# Patient Record
Sex: Female | Born: 1965 | Race: White | Hispanic: No | Marital: Married | Smoking: Never smoker
Health system: Southern US, Community
[De-identification: ages and names within clinical notes are randomized; demographics above are authoritative.]

## PROBLEM LIST (undated history)

## (undated) DIAGNOSIS — I10 Essential (primary) hypertension: Secondary | ICD-10-CM

## (undated) DIAGNOSIS — E785 Hyperlipidemia, unspecified: Secondary | ICD-10-CM

## (undated) DIAGNOSIS — E079 Disorder of thyroid, unspecified: Secondary | ICD-10-CM

## (undated) HISTORY — PX: ANKLE ARTHROSCOPY: SUR85

## (undated) HISTORY — PX: TONSILLECTOMY: SUR1361

## (undated) HISTORY — DX: Hyperlipidemia, unspecified: E78.5

## (undated) HISTORY — DX: Essential (primary) hypertension: I10

---

## 1997-06-07 ENCOUNTER — Ambulatory Visit (HOSPITAL_COMMUNITY): Admission: RE | Admit: 1997-06-07 | Discharge: 1997-06-07 | Payer: Self-pay | Admitting: Otolaryngology

## 1998-03-30 ENCOUNTER — Other Ambulatory Visit: Admission: RE | Admit: 1998-03-30 | Discharge: 1998-03-30 | Payer: Self-pay | Admitting: Obstetrics and Gynecology

## 1999-04-29 ENCOUNTER — Other Ambulatory Visit: Admission: RE | Admit: 1999-04-29 | Discharge: 1999-04-29 | Payer: Self-pay | Admitting: Obstetrics and Gynecology

## 2000-08-25 ENCOUNTER — Other Ambulatory Visit: Admission: RE | Admit: 2000-08-25 | Discharge: 2000-08-25 | Payer: Self-pay | Admitting: Obstetrics and Gynecology

## 2001-09-30 ENCOUNTER — Other Ambulatory Visit: Admission: RE | Admit: 2001-09-30 | Discharge: 2001-09-30 | Payer: Self-pay | Admitting: Obstetrics and Gynecology

## 2002-03-30 ENCOUNTER — Other Ambulatory Visit: Admission: RE | Admit: 2002-03-30 | Discharge: 2002-03-30 | Payer: Self-pay | Admitting: Obstetrics and Gynecology

## 2002-04-05 ENCOUNTER — Encounter: Admission: RE | Admit: 2002-04-05 | Discharge: 2002-04-05 | Payer: Self-pay | Admitting: Obstetrics and Gynecology

## 2002-04-05 ENCOUNTER — Encounter: Payer: Self-pay | Admitting: Obstetrics and Gynecology

## 2003-05-09 ENCOUNTER — Other Ambulatory Visit: Admission: RE | Admit: 2003-05-09 | Discharge: 2003-05-09 | Payer: Self-pay | Admitting: Obstetrics and Gynecology

## 2003-10-03 ENCOUNTER — Encounter: Admission: RE | Admit: 2003-10-03 | Discharge: 2003-10-03 | Payer: Self-pay | Admitting: Endocrinology

## 2005-01-21 ENCOUNTER — Other Ambulatory Visit: Admission: RE | Admit: 2005-01-21 | Discharge: 2005-01-21 | Payer: Self-pay | Admitting: Obstetrics and Gynecology

## 2005-02-13 ENCOUNTER — Encounter: Admission: RE | Admit: 2005-02-13 | Discharge: 2005-02-13 | Payer: Self-pay | Admitting: Obstetrics and Gynecology

## 2005-11-28 ENCOUNTER — Encounter: Admission: RE | Admit: 2005-11-28 | Discharge: 2005-11-28 | Payer: Self-pay | Admitting: Endocrinology

## 2006-03-16 ENCOUNTER — Encounter: Admission: RE | Admit: 2006-03-16 | Discharge: 2006-03-16 | Payer: Self-pay | Admitting: Obstetrics and Gynecology

## 2007-10-25 ENCOUNTER — Encounter: Admission: RE | Admit: 2007-10-25 | Discharge: 2007-10-25 | Payer: Self-pay | Admitting: Obstetrics and Gynecology

## 2008-05-08 IMAGING — MG MM SCREEN MAMMOGRAM BILATERAL
4 series · 4 of 4 positions shown · non-contrast
Comparison: none

DG SCREEN MAMMOGRAM BILATERAL
Bilateral CC and MLO view(s) were taken.
Prior study comparison: April 05, 2002, bilateral diagnostic mammogram.

DIGITAL SCREENING MAMMOGRAM WITH CAD:
The breast tissue is extremely dense.  There is no dominant mass, architectural distortion or 
calcification to suggest malignancy.

[R CC]
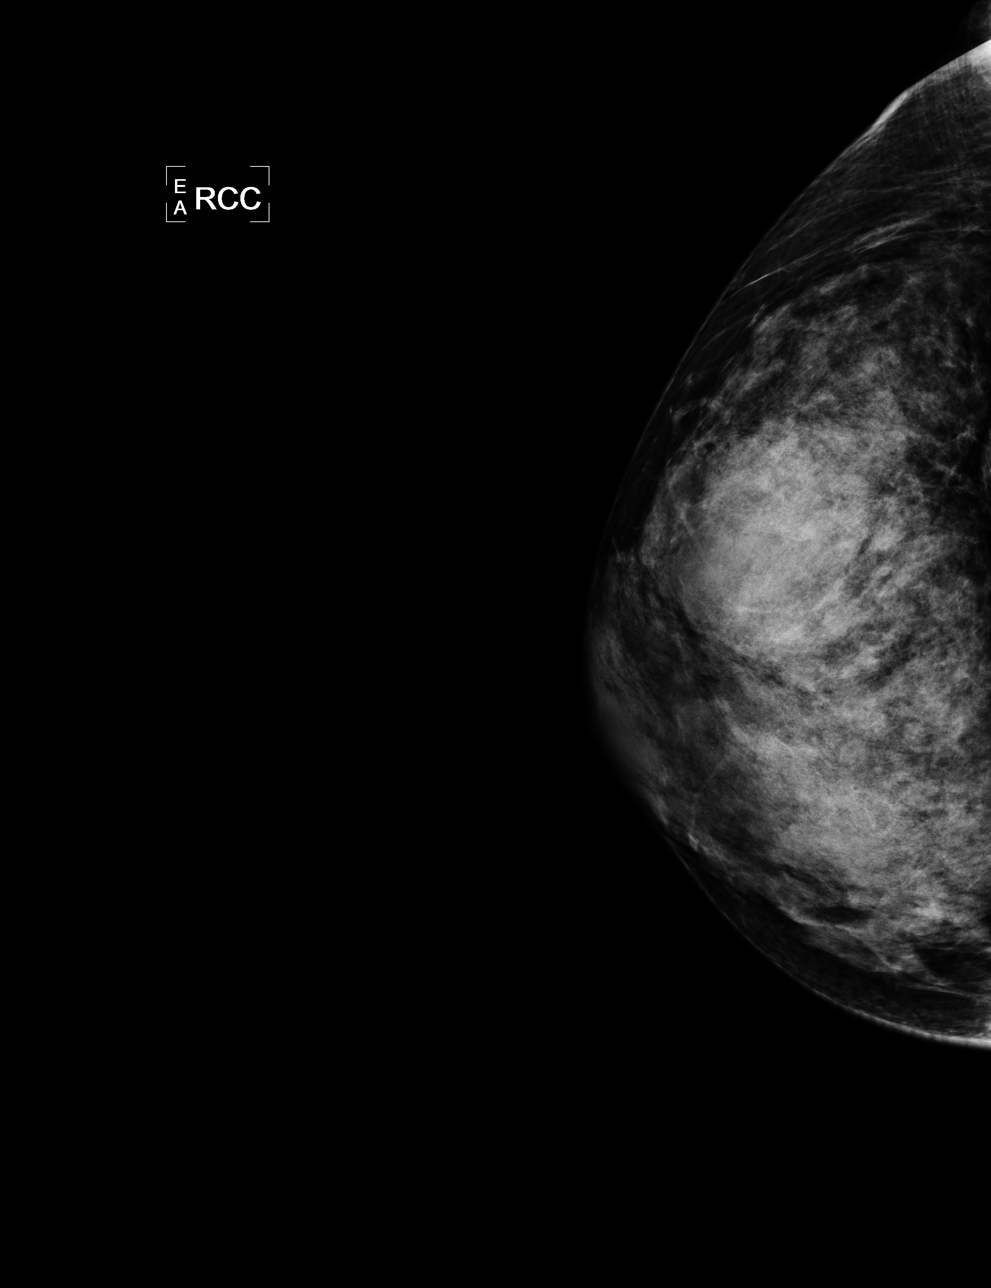

[L CC]
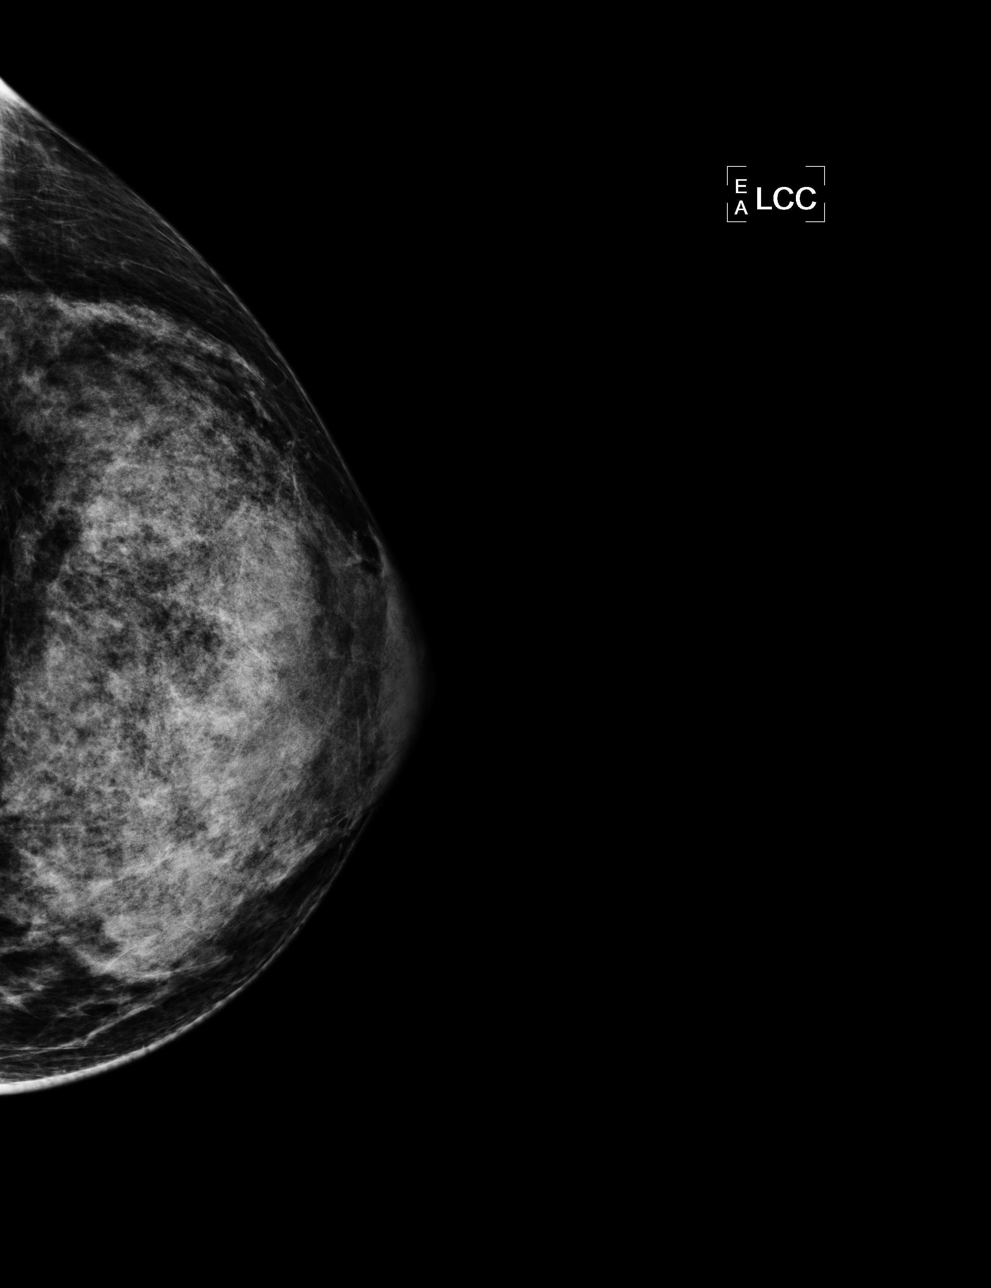

[L MLO]
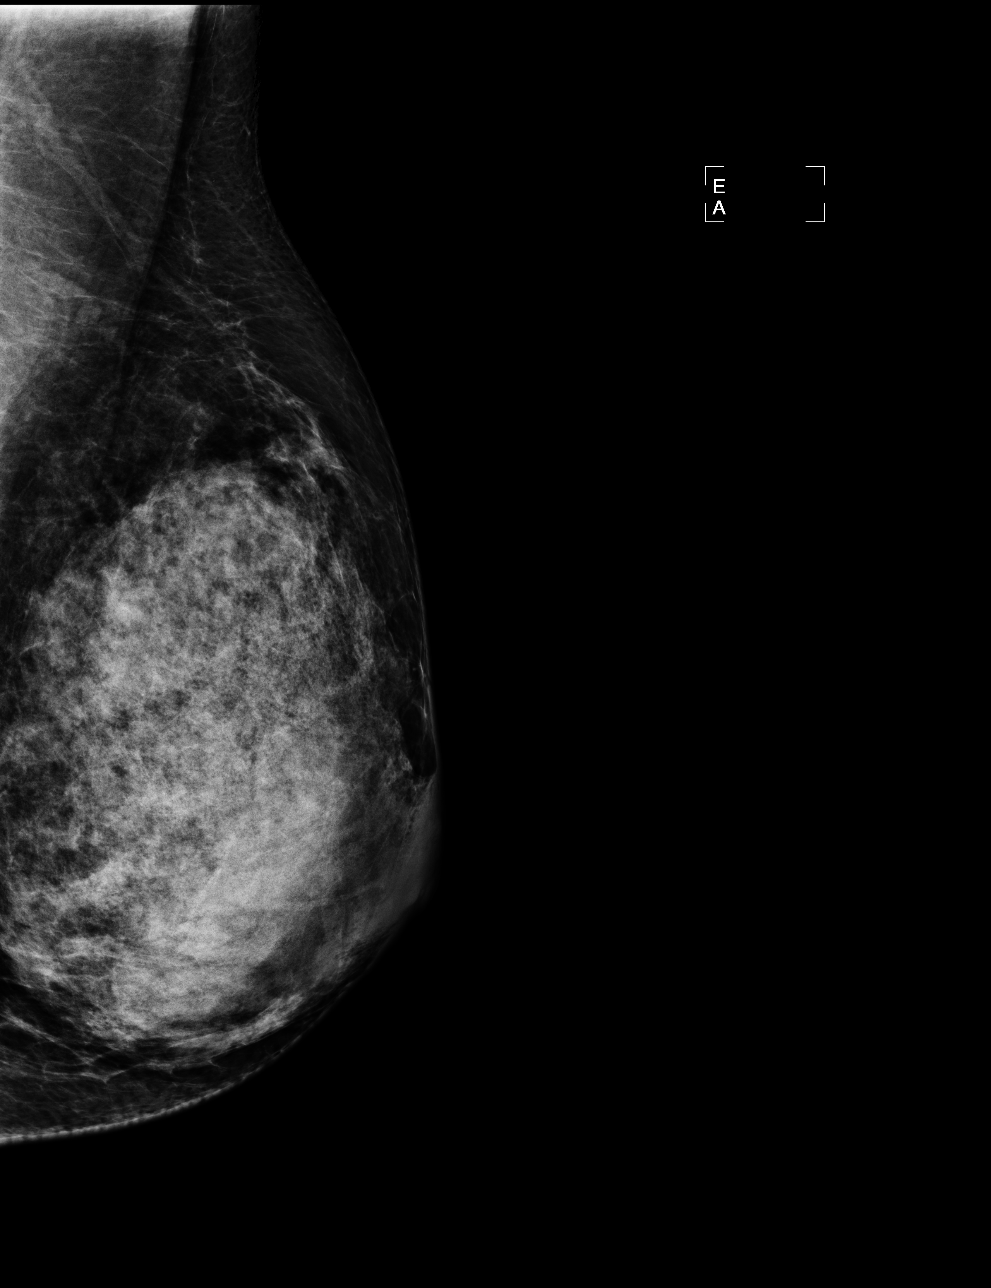

[R MLO]
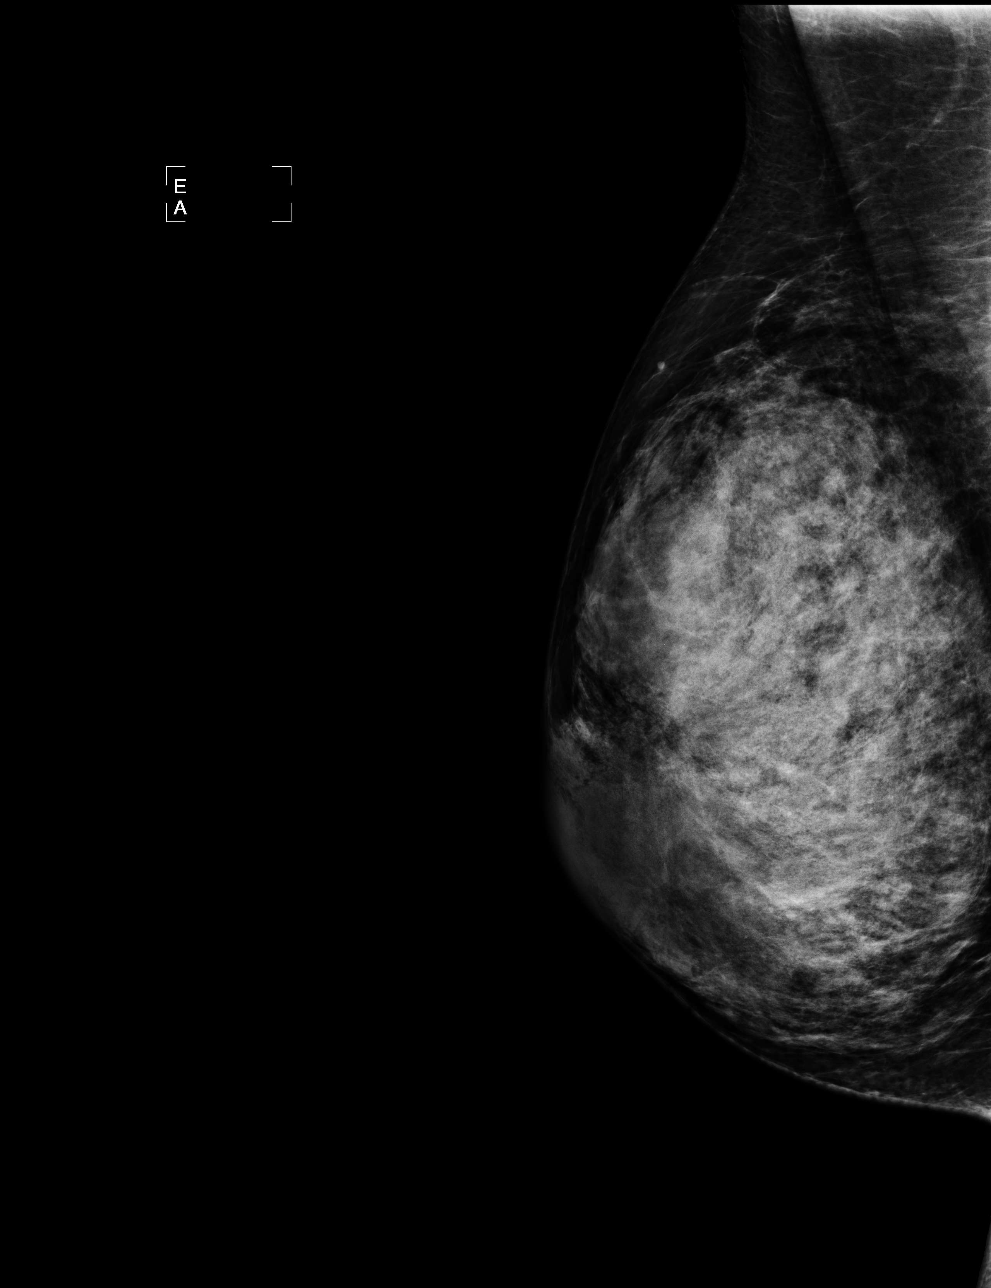

[4 of 4 positions shown; findings below may reference images not displayed]

IMPRESSION: No mammographic evidence of malignancy.  Suggest yearly screening mammography.

ASSESSMENT: Negative - BI-RADS 1

Screening mammogram in 1 year.
ANALYZED BY COMPUTER AIDED DETECTION. , THIS PROCEDURE WAS A DIGITAL MAMMOGRAM.

## 2008-06-11 IMAGING — US US SOFT TISSUE HEAD/NECK
1 series · 14 of 25 positions shown · non-contrast
Comparison: Thyroid ultrasound 10/03/03.

CLINICAL DATA: Follow up goiter. 
 THYROID ULTRASOUND:
TECHNIQUE: Ultrasound examination of the soft tissues was performed in the area of clinical concern.

[Series 1: unknown · 0.09mm/px · 14 of 30 slices shown]
[im 1/30]
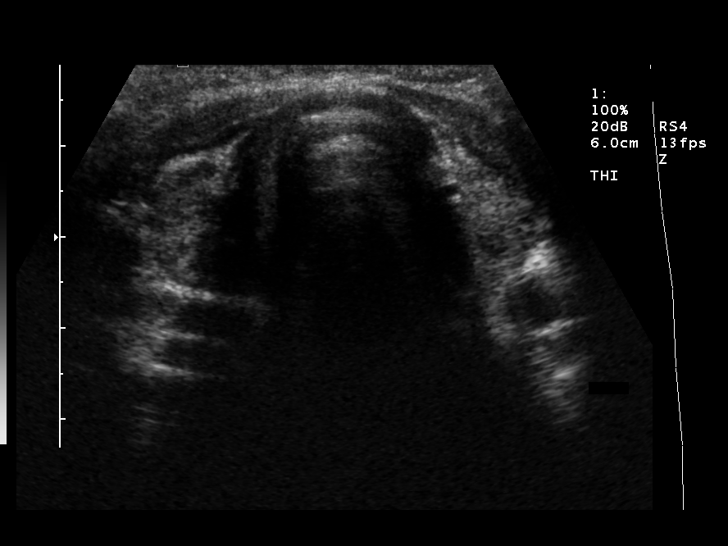
[im 3/30]
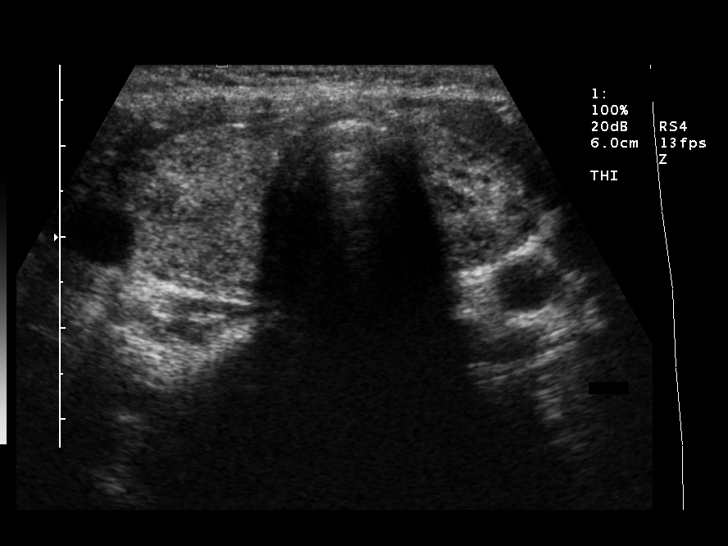
[im 5/30]
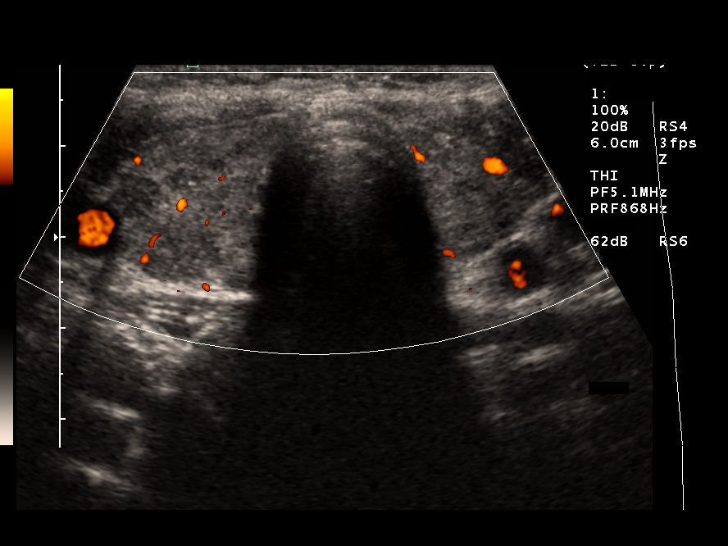
[im 8/30]
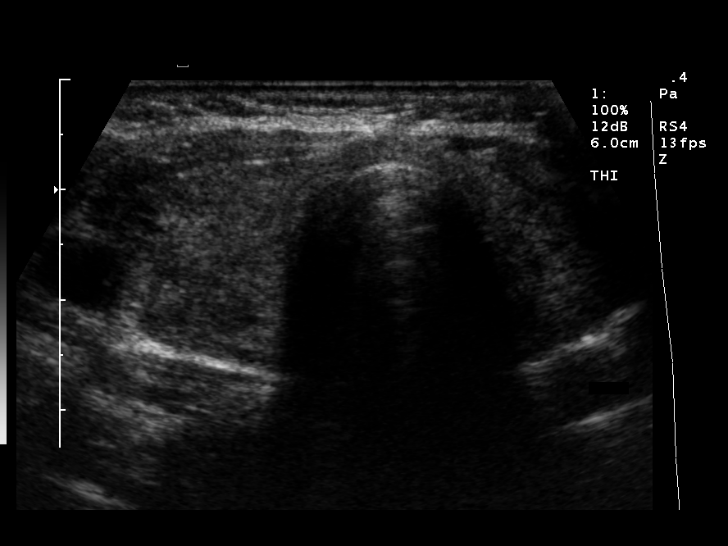
[im 10/30]
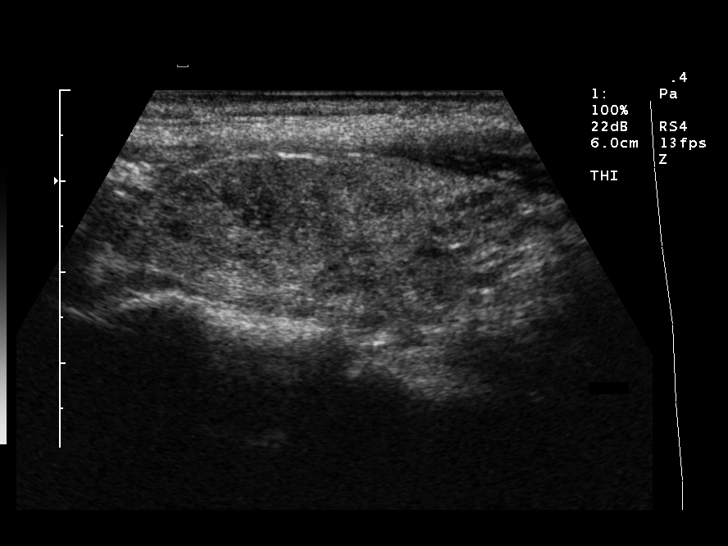
[im 11/30]
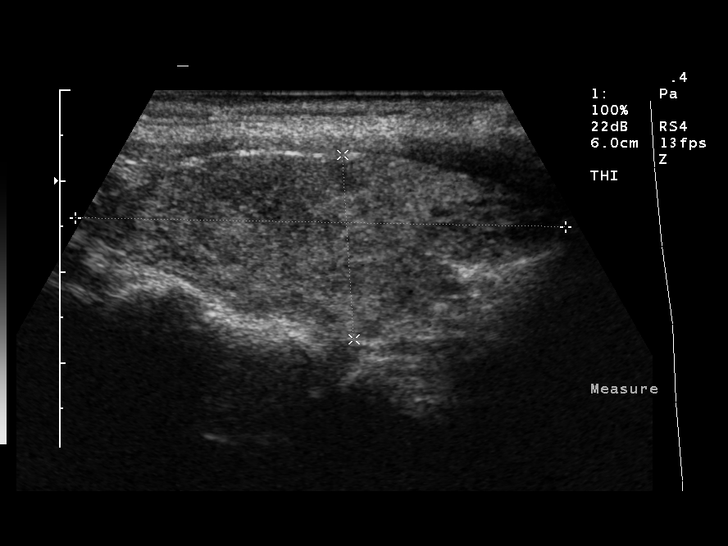
[im 14/30]
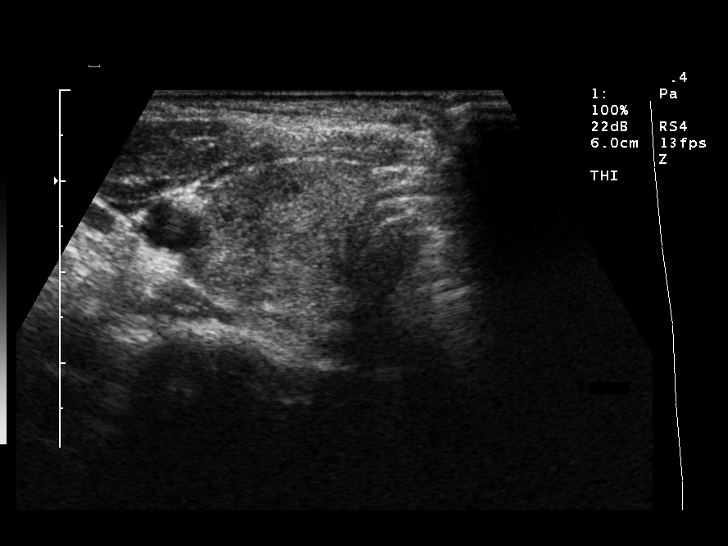
[im 16/30]
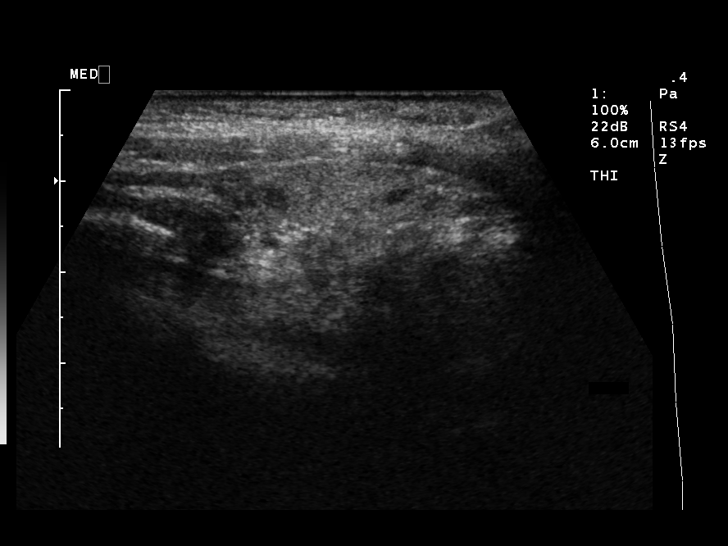
[im 19/30]
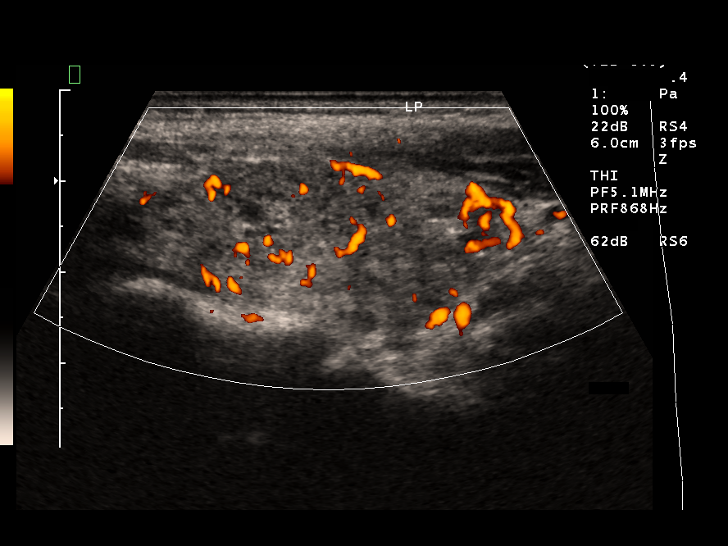
[im 20/30]
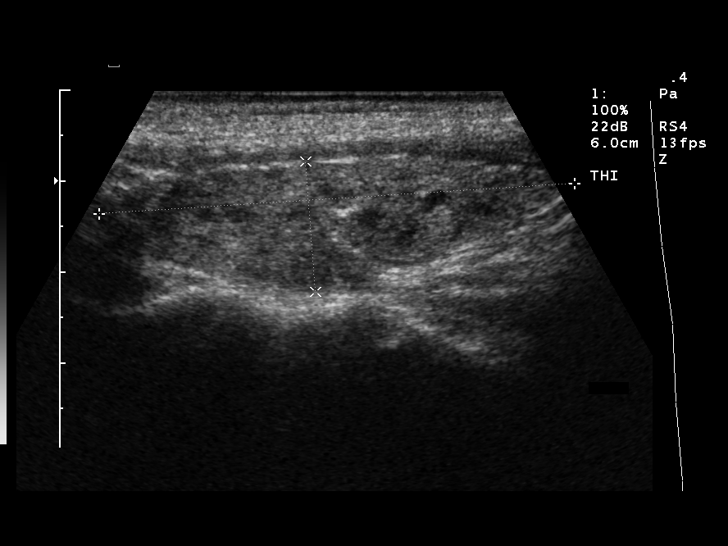
[im 22/30]
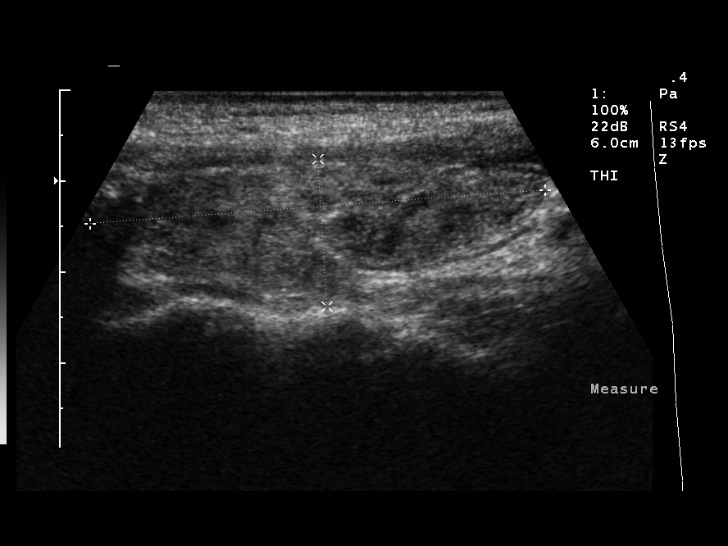
[im 25/30]
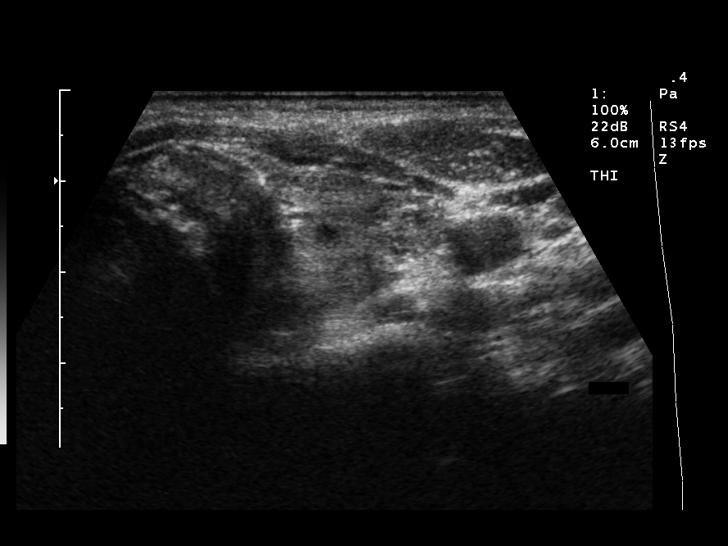
[im 27/30]
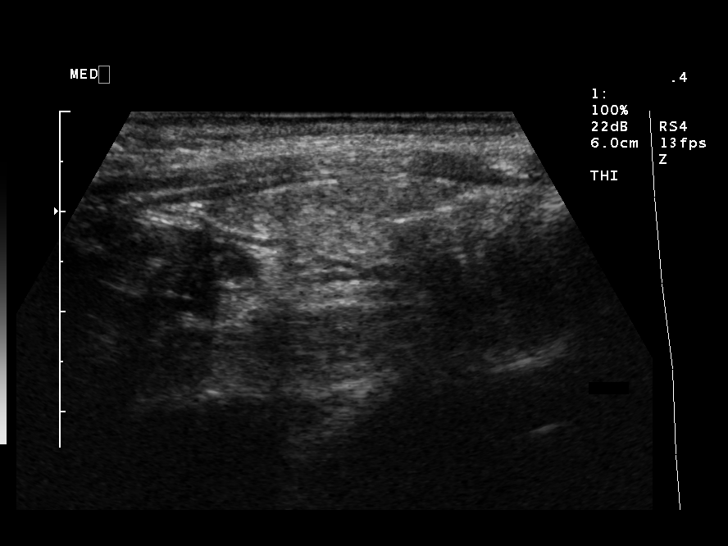
[im 30/30]
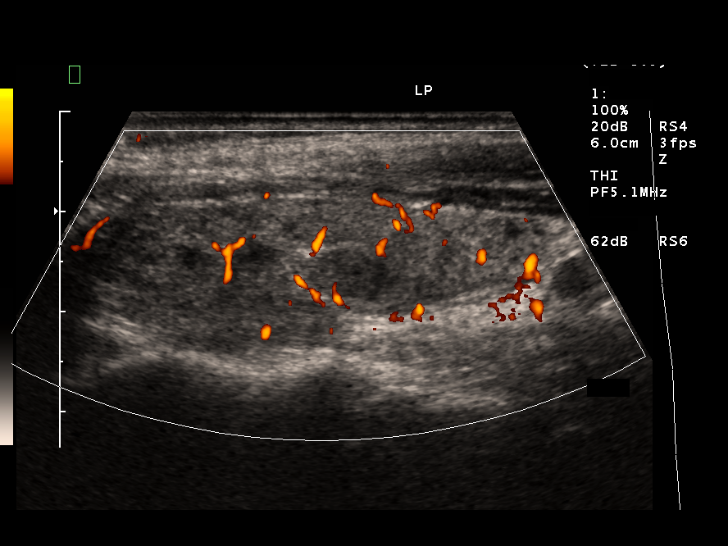

[14 of 25 positions shown; findings below may reference images not displayed]

FINDINGS: Currently right lobe thyroid measures 5.4cm long x 2cm AP x 1.9cm wide (previously 5.8 x 2 x 1.9cm).  Left lobe currently measures 5.2cm long x 1.4cm AP x 1.8cm wide (previously 5.2 x 1.4 x 1.8cm).  The isthmus measures 3mm as before.  No change in thyroid gland diffuse inhomogeneity and sub-cm nodules.
IMPRESSION: Stable multinodular goiter.

## 2009-02-21 ENCOUNTER — Encounter: Admission: RE | Admit: 2009-02-21 | Discharge: 2009-02-21 | Payer: Self-pay | Admitting: Obstetrics and Gynecology

## 2010-02-17 ENCOUNTER — Encounter (HOSPITAL_BASED_OUTPATIENT_CLINIC_OR_DEPARTMENT_OTHER): Payer: Self-pay | Admitting: General Surgery

## 2012-03-19 ENCOUNTER — Encounter (HOSPITAL_BASED_OUTPATIENT_CLINIC_OR_DEPARTMENT_OTHER): Payer: Self-pay | Admitting: *Deleted

## 2012-03-19 ENCOUNTER — Emergency Department (HOSPITAL_BASED_OUTPATIENT_CLINIC_OR_DEPARTMENT_OTHER)
Admission: EM | Admit: 2012-03-19 | Discharge: 2012-03-19 | Disposition: A | Discharge status: Home / Self Care | Payer: No Typology Code available for payment source | Attending: Emergency Medicine | Admitting: Emergency Medicine

## 2012-03-19 DIAGNOSIS — H538 Other visual disturbances: Secondary | ICD-10-CM | POA: Insufficient documentation

## 2012-03-19 DIAGNOSIS — Z79899 Other long term (current) drug therapy: Secondary | ICD-10-CM | POA: Insufficient documentation

## 2012-03-19 DIAGNOSIS — H5710 Ocular pain, unspecified eye: Secondary | ICD-10-CM | POA: Insufficient documentation

## 2012-03-19 DIAGNOSIS — E079 Disorder of thyroid, unspecified: Secondary | ICD-10-CM | POA: Insufficient documentation

## 2012-03-19 HISTORY — DX: Disorder of thyroid, unspecified: E07.9

## 2012-03-19 MED ORDER — TETRACAINE HCL 0.5 % OP SOLN
OPHTHALMIC | Status: AC
  Administered 2012-03-19: 20:00:00
  Filled 2012-03-19: qty 2

## 2012-03-19 MED ORDER — HYDROCODONE-ACETAMINOPHEN 5-325 MG PO TABS
1.0000 | ORAL_TABLET | Freq: Once | ORAL | Status: AC
  Administered 2012-03-19: 1 via ORAL
  Filled 2012-03-19: qty 1

## 2012-03-19 MED ORDER — CIPROFLOXACIN HCL 0.3 % OP SOLN: 2.0000 [drp] | Freq: Four times a day (QID) | OPHTHALMIC | Status: DC

## 2012-03-19 MED ORDER — FLUORESCEIN SODIUM 1 MG OP STRP
ORAL_STRIP | OPHTHALMIC | Status: AC
  Administered 2012-03-19: 20:00:00
  Filled 2012-03-19: qty 1

## 2012-03-19 MED ORDER — HYDROCODONE-ACETAMINOPHEN 5-325 MG PO TABS: 2.0000 | ORAL_TABLET | ORAL | Status: DC | PRN

## 2012-03-19 NOTE — ED Provider Notes (Signed)
History     CSN: [id number]  Arrival date & time 03/19/12  1749   First MD Initiated Contact with Patient 03/19/12 1756      Chief Complaint  Patient presents with  . Eye Pain    (Consider location/radiation/quality/duration/timing/severity/associated sxs/prior treatment) HPI Comments: Pt states that she was at work when the discomfort started:pt states that she put in a new pair of contacts today:no know injury:pt states that the vision seems a little blurry  Patient is a 47 y.o. female presenting with eye pain. The history is provided by the patient. No language interpreter was used.  Eye Pain This is a new problem. The current episode started today. The problem occurs constantly. The problem has been unchanged. Pertinent negatives include no fever. Nothing aggravates the symptoms. She has tried nothing for the symptoms.    Past Medical History  Diagnosis Date  . Thyroid disease     Past Surgical History  Procedure Laterality Date  . Tonsillectomy      History reviewed. No pertinent family history.  History  Substance Use Topics  . Smoking status: Never Smoker   . Smokeless tobacco: Not on file  . Alcohol Use: No    OB History   Grav Para Term Preterm Abortions TAB SAB Ect Mult Living                  Review of Systems  Constitutional: Negative for fever.  Eyes: Positive for pain.  Respiratory: Negative.   Cardiovascular: Negative.     Allergies  Septra  Home Medications   Current Outpatient Rx  Name  Route  Sig  Dispense  Refill  . fish oil-omega-3 fatty acids 1000 MG capsule   Oral   Take 2 g by mouth daily.         Marland Kitchen levothyroxine (SYNTHROID, LEVOTHROID) 125 MCG tablet   Oral   Take 125 mcg by mouth daily.         . ciprofloxacin (CILOXAN) 0.3 % ophthalmic solution   Right Eye   Place 2 drops into the right eye 4 (four) times daily. Administer 1 drop, every 2 hours, while awake, for 2 days. Then 1 drop, every 4 hours, while awake, for  the next 5 days.   5 mL   0   . HYDROcodone-acetaminophen (NORCO/VICODIN) 5-325 MG per tablet   Oral   Take 2 tablets by mouth every 4 (four) hours as needed for pain.   10 tablet   0     BP 130/73  Pulse 67  Temp(Src) 98 F (36.7 C) (Oral)  Resp 16  Ht 5\' 7"  (1.702 m)  Wt 160 lb (72.576 kg)  BMI 25.05 kg/m2  SpO2 99%  Physical Exam  Nursing note and vitals reviewed. Constitutional: She is oriented to person, place, and time. She appears well-developed and well-nourished.  HENT:  Head: Normocephalic.  Right Ear: External ear normal.  Left Ear: External ear normal.  Mouth/Throat: Oropharynx is clear and moist.  Eyes: Conjunctivae and EOM are normal. Pupils are equal, round, and reactive to light. No foreign bodies found. Right eye exhibits no discharge. No foreign body present in the right eye.  Fundoscopic exam:      The right eye shows no exudate.  Slit lamp exam:      The right eye shows no corneal abrasion and no fluorescein uptake.  Pressure was 14  Neck: Normal range of motion. Neck supple.  Cardiovascular: Normal rate and regular rhythm.  Pulmonary/Chest: Effort normal and breath sounds normal.  Musculoskeletal: Normal range of motion.  Neurological: She is alert and oriented to person, place, and time.  Skin: Skin is warm and dry.  Psychiatric: She has a normal mood and affect.    ED Course  Procedures (including critical care time)  Labs Reviewed - No data to display No results found.   1. Eye pain       MDM  Pressure is good:no ulceration or abrasion noted:pt given drops and instructed on no contact lense wearing:pt to follow up with opthomology        Teressa Lower, NP 03/19/12 2030

## 2012-03-19 NOTE — ED Notes (Signed)
Rx x 2 given for cipro eye drops and vicodin- pt d/c home with a ride

## 2012-03-19 NOTE — ED Notes (Signed)
Pt c/o right eye pain with ? Foreign body

## 2012-03-21 NOTE — ED Provider Notes (Signed)
History/physical exam/procedure(s) were performed by non-physician practitioner and as supervising physician I was immediately available for consultation/collaboration. I have reviewed all notes and am in agreement with care and plan. I saw and examined patient with Ms. Rubin Payor.  No ulcer seen, iop normal.   Hilario Quarry, MD 03/21/12 [id number]

## 2014-10-23 ENCOUNTER — Other Ambulatory Visit (HOSPITAL_COMMUNITY): Payer: Self-pay | Admitting: Respiratory Therapy

## 2014-10-23 DIAGNOSIS — G4730 Sleep apnea, unspecified: Secondary | ICD-10-CM

## 2014-10-23 DIAGNOSIS — G4700 Insomnia, unspecified: Secondary | ICD-10-CM

## 2016-06-25 ENCOUNTER — Other Ambulatory Visit: Payer: Self-pay | Admitting: Obstetrics and Gynecology

## 2016-06-25 DIAGNOSIS — Z1231 Encounter for screening mammogram for malignant neoplasm of breast: Secondary | ICD-10-CM

## 2016-07-04 ENCOUNTER — Ambulatory Visit
Admission: RE | Admit: 2016-07-04 | Discharge: 2016-07-04 | Disposition: A | Discharge status: Home / Self Care | Payer: Managed Care, Other (non HMO) | Source: Ambulatory Visit | Attending: Obstetrics and Gynecology | Admitting: Obstetrics and Gynecology

## 2016-07-04 DIAGNOSIS — Z1231 Encounter for screening mammogram for malignant neoplasm of breast: Secondary | ICD-10-CM

## 2019-01-16 IMAGING — MG 2D DIGITAL SCREENING BILATERAL MAMMOGRAM WITH CAD AND ADJUNCT TO
8 of 12 series · 8 of 28 positions shown · non-contrast
Comparison: Previous exam(s).

CLINICAL DATA: Screening.

EXAM:
2D DIGITAL SCREENING BILATERAL MAMMOGRAM WITH CAD AND ADJUNCT TOMO

[L CC]
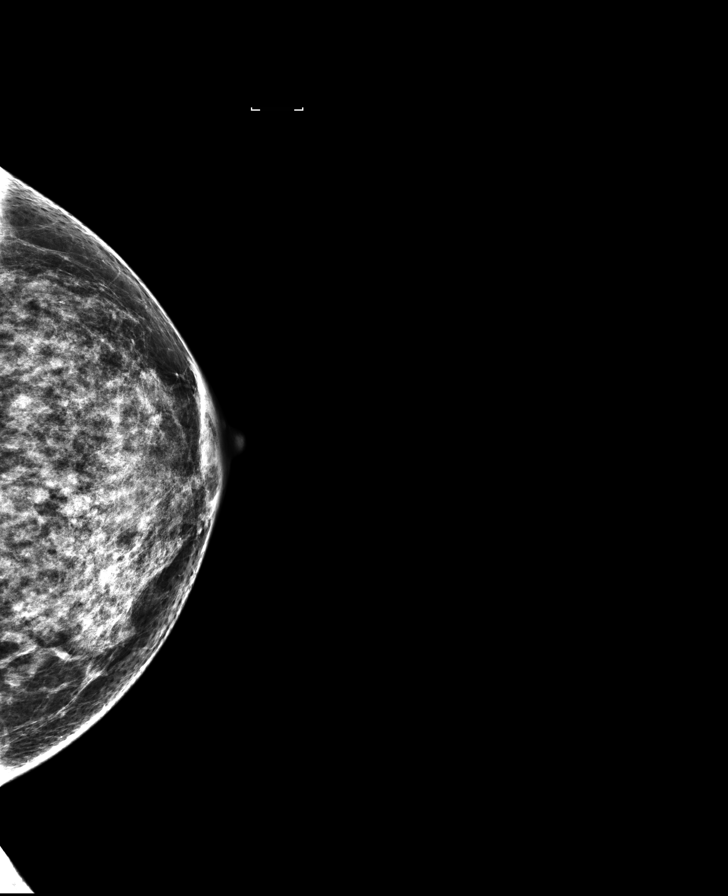

[R CC synth-2D]
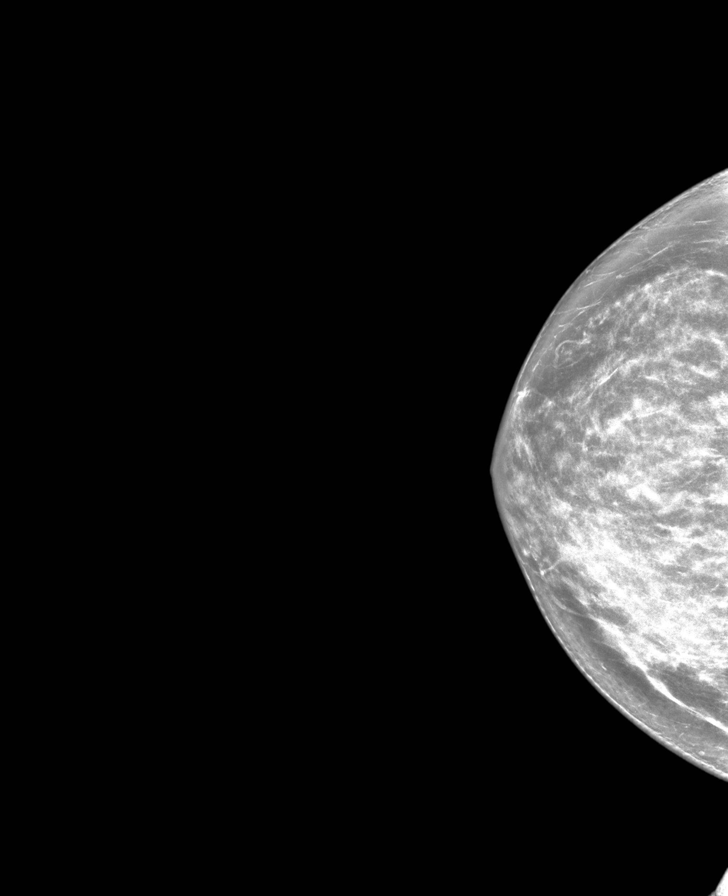

[R MLO synth-2D]
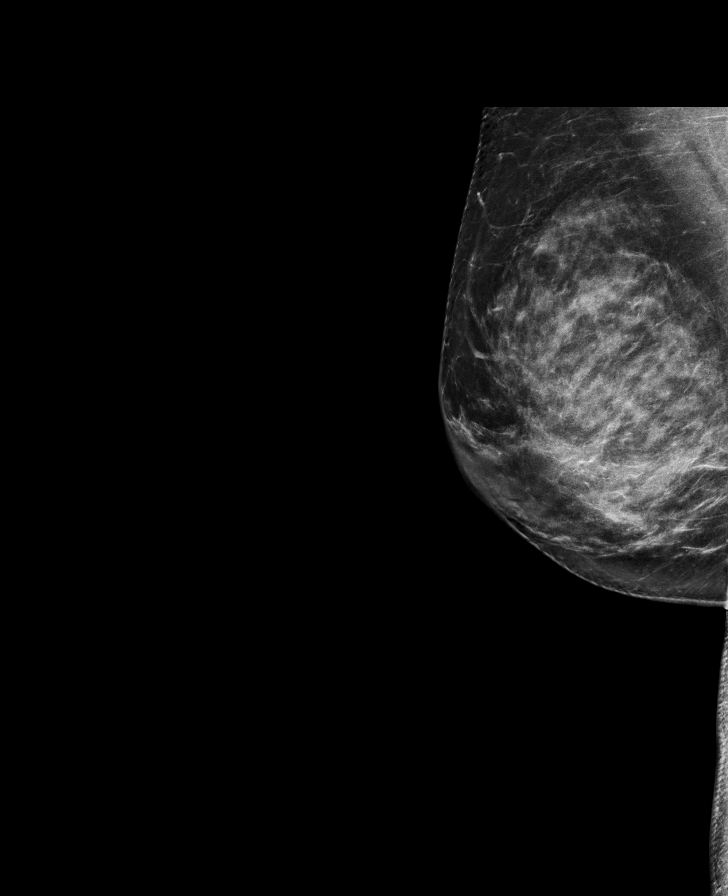

[L MLO synth-2D]
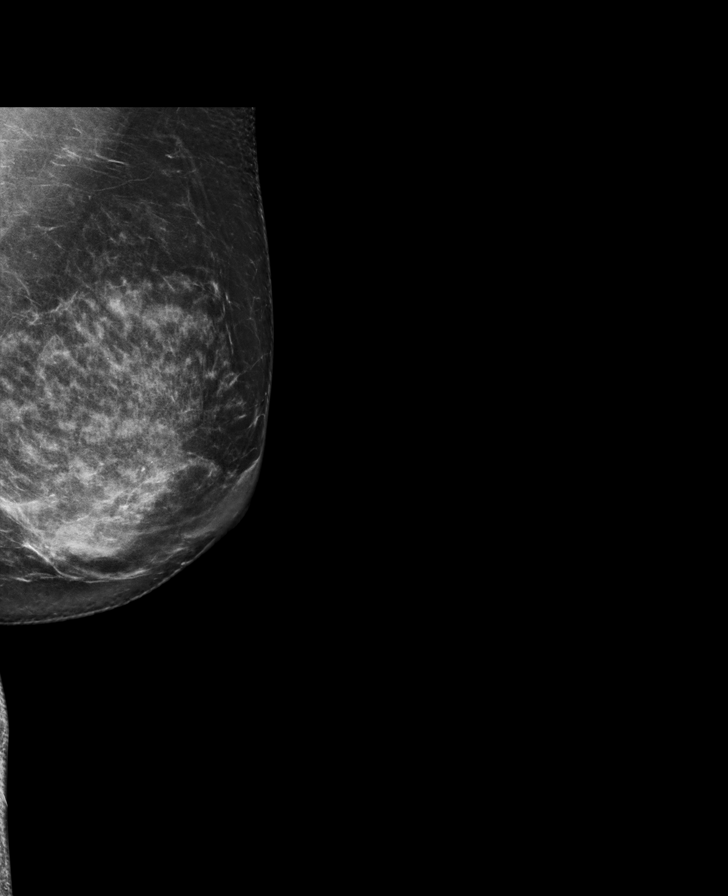

[R CC]
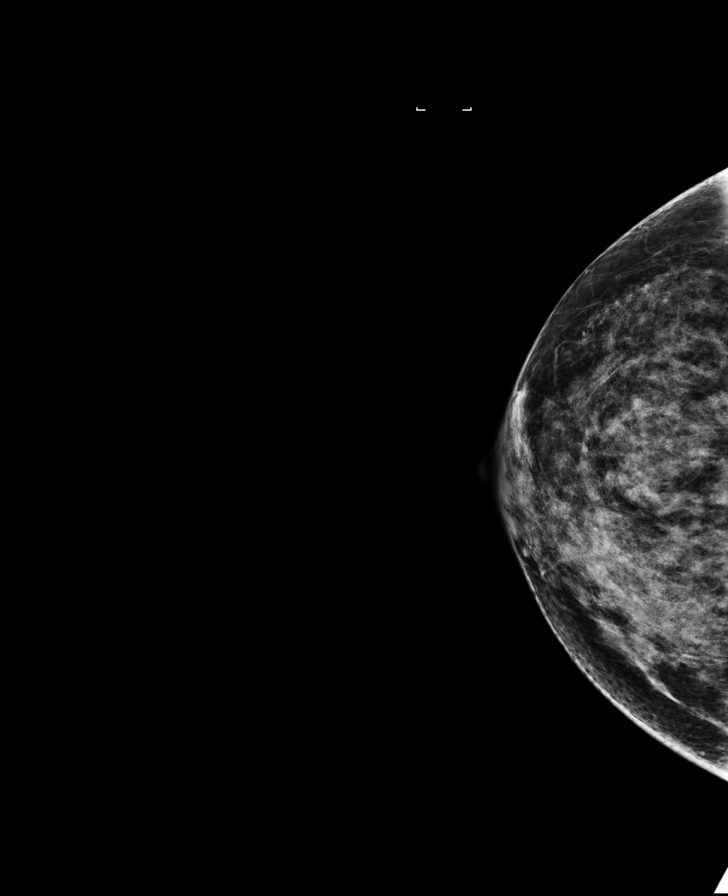

[L CC synth-2D]
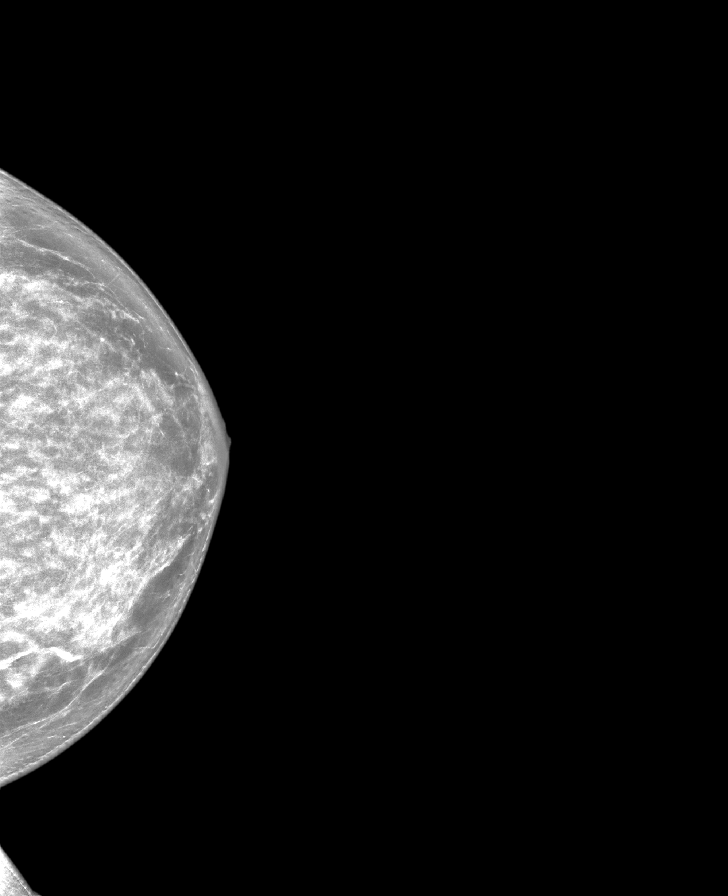

[L MLO]
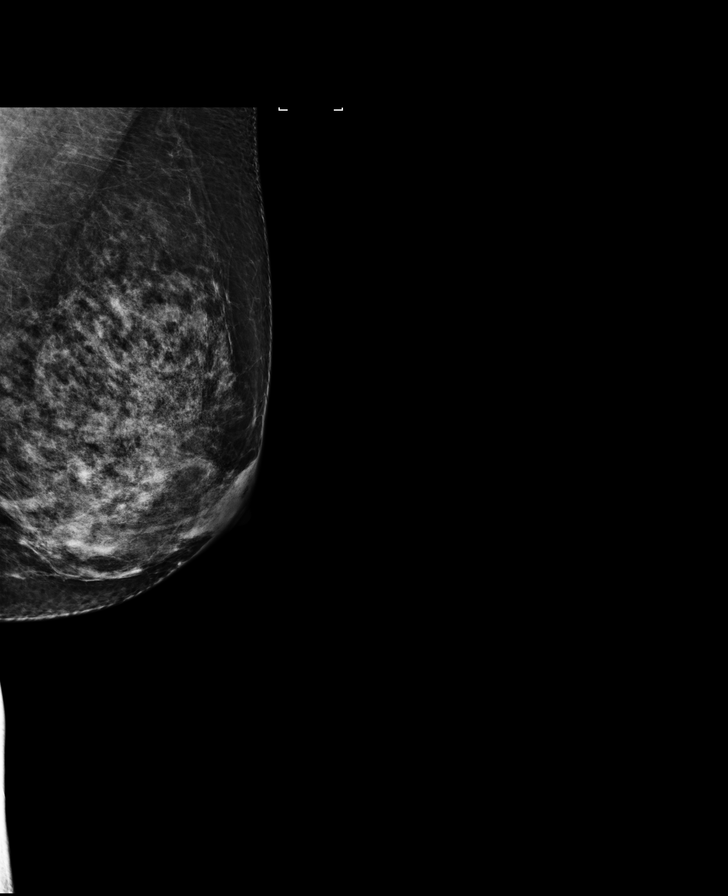

[R MLO]
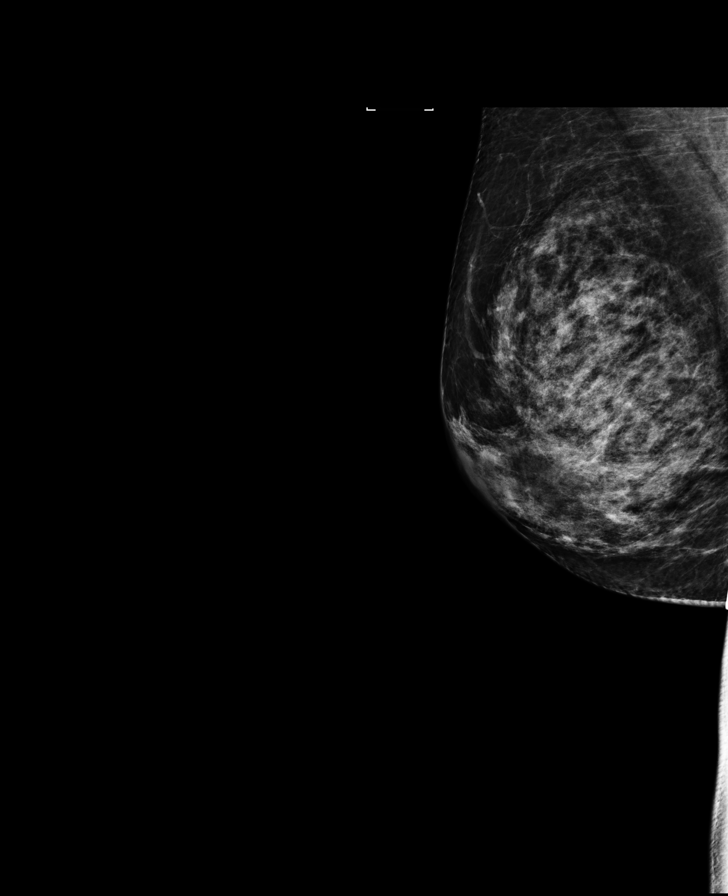

[8 of 28 positions shown; findings below may reference images not displayed]

ACR Breast Density Category c: The breast tissue is heterogeneously
dense, which may obscure small masses.
FINDINGS: There are no findings suspicious for malignancy. Images were
processed with CAD.
IMPRESSION: No mammographic evidence of malignancy. A result letter of this
screening mammogram will be mailed directly to the patient.

RECOMMENDATION:
Screening mammogram in one year. (Code:[id number])

BI-RADS CATEGORY  1: Negative.

## 2019-03-18 ENCOUNTER — Ambulatory Visit: Payer: 59 | Attending: Internal Medicine

## 2019-03-18 ENCOUNTER — Telehealth: Payer: Self-pay | Admitting: *Deleted

## 2019-03-18 DIAGNOSIS — Z23 Encounter for immunization: Secondary | ICD-10-CM | POA: Insufficient documentation

## 2019-03-18 NOTE — Telephone Encounter (Signed)
First responder, Vaccine appt 4:30 2/19. Parking instructions given.

## 2019-03-18 NOTE — Progress Notes (Signed)
   Covid-19 Vaccination Clinic  Name:  Melinda Morales    MRN: [id number] DOB: 1965-07-26  03/18/2019  Melinda Morales was observed post Covid-19 immunization for 15 minutes without incidence. She was provided with Vaccine Information Sheet and instruction to access the V-Safe system.   Melinda Morales was instructed to call 911 with any severe reactions post vaccine: Marland Kitchen Difficulty breathing  . Swelling of your face and throat  . A fast heartbeat  . A bad rash all over your body  . Dizziness and weakness    Immunizations Administered    Name Date Dose VIS Date Route   Pfizer COVID-19 Vaccine 03/18/2019  4:23 PM 0.3 mL 01/07/2019 Intramuscular   Manufacturer: [employer], [employer]   Lot: [id number]   NDC: [address]-9

## 2019-04-12 ENCOUNTER — Ambulatory Visit: Payer: 59 | Attending: Internal Medicine

## 2019-04-12 DIAGNOSIS — Z23 Encounter for immunization: Secondary | ICD-10-CM

## 2019-04-12 NOTE — Progress Notes (Signed)
   Covid-19 Vaccination Clinic  Name:  Melinda Morales    MRN: [id number] DOB: 1965-05-20  04/12/2019  [name] was observed post Covid-19 immunization for 15 minutes without incident. She was provided with Vaccine Information Sheet and instruction to access the V-Safe system.   [name] was instructed to call 911 with any severe reactions post vaccine: Marland Kitchen Difficulty breathing  . Swelling of face and throat  . A fast heartbeat  . A bad rash all over body  . Dizziness and weakness   Immunizations Administered    Name Date Dose VIS Date Route   Pfizer COVID-19 Vaccine 04/12/2019  8:32 AM 0.3 mL 01/07/2019 Intramuscular   Manufacturer: [employer], [employer]   Lot: [id number]   NDC: [address]-0

## 2021-03-01 LAB — HM PAP SMEAR: HM Pap smear: NEGATIVE

## 2021-10-09 ENCOUNTER — Other Ambulatory Visit: Payer: Self-pay | Admitting: Orthopaedic Surgery

## 2021-10-09 ENCOUNTER — Ambulatory Visit
Admission: RE | Admit: 2021-10-09 | Discharge: 2021-10-09 | Disposition: A | Discharge status: Home / Self Care | Payer: 59 | Source: Ambulatory Visit | Attending: Orthopaedic Surgery | Admitting: Orthopaedic Surgery

## 2021-10-09 DIAGNOSIS — M25571 Pain in right ankle and joints of right foot: Secondary | ICD-10-CM

## 2021-10-27 DIAGNOSIS — R000 Tachycardia, unspecified: Secondary | ICD-10-CM

## 2021-10-27 HISTORY — DX: Tachycardia, unspecified: R00.0

## 2021-11-06 ENCOUNTER — Other Ambulatory Visit: Payer: Self-pay

## 2021-11-06 ENCOUNTER — Encounter (HOSPITAL_BASED_OUTPATIENT_CLINIC_OR_DEPARTMENT_OTHER): Payer: Self-pay | Admitting: Emergency Medicine

## 2021-11-06 ENCOUNTER — Emergency Department (HOSPITAL_BASED_OUTPATIENT_CLINIC_OR_DEPARTMENT_OTHER)
Admission: EM | Admit: 2021-11-06 | Discharge: 2021-11-06 | Disposition: A | Discharge status: Home / Self Care | Payer: 59 | Attending: Emergency Medicine | Admitting: Emergency Medicine

## 2021-11-06 ENCOUNTER — Emergency Department (HOSPITAL_BASED_OUTPATIENT_CLINIC_OR_DEPARTMENT_OTHER): Payer: 59

## 2021-11-06 DIAGNOSIS — R079 Chest pain, unspecified: Secondary | ICD-10-CM

## 2021-11-06 DIAGNOSIS — R002 Palpitations: Secondary | ICD-10-CM | POA: Diagnosis not present

## 2021-11-06 DIAGNOSIS — R0602 Shortness of breath: Secondary | ICD-10-CM | POA: Insufficient documentation

## 2021-11-06 DIAGNOSIS — R0789 Other chest pain: Secondary | ICD-10-CM | POA: Insufficient documentation

## 2021-11-06 DIAGNOSIS — E039 Hypothyroidism, unspecified: Secondary | ICD-10-CM | POA: Insufficient documentation

## 2021-11-06 LAB — BASIC METABOLIC PANEL
Anion gap: 10 (ref 5–15)
BUN: 14 mg/dL (ref 6–20)
CO2: 25 mmol/L (ref 22–32)
Calcium: 9.2 mg/dL (ref 8.9–10.3)
Chloride: 104 mmol/L (ref 98–111)
Creatinine, Ser: 0.85 mg/dL (ref 0.44–1.00)
GFR, Estimated: >60 mL/min (ref >60)
Glucose, Bld: 196 mg/dL — ABNORMAL HIGH (ref 70–99)
Potassium: 3.6 mmol/L (ref 3.5–5.1)
Sodium: 139 mmol/L (ref 135–145)

## 2021-11-06 LAB — D-DIMER, QUANTITATIVE: D-Dimer, Quant: <0.27 ug/mL-FEU (ref 0.00–0.50)

## 2021-11-06 LAB — CBC
HCT: 44.3 % (ref 36.0–46.0)
Hemoglobin: 14.6 g/dL (ref 12.0–15.0)
MCH: 29.6 pg (ref 26.0–34.0)
MCHC: 33 g/dL (ref 30.0–36.0)
MCV: 89.9 fL (ref 80.0–100.0)
Platelets: 267 10*3/uL (ref 150–400)
RBC: 4.93 MIL/uL (ref 3.87–5.11)
RDW: 12.7 % (ref 11.5–15.5)
WBC: 5.4 10*3/uL (ref 4.0–10.5)
nRBC: 0 % (ref 0.0–0.2)

## 2021-11-06 LAB — URINALYSIS, ROUTINE W REFLEX MICROSCOPIC
Bilirubin Urine: NEGATIVE
Glucose, UA: NEGATIVE mg/dL
Ketones, ur: NEGATIVE mg/dL
Leukocytes,Ua: NEGATIVE
Nitrite: NEGATIVE
Protein, ur: NEGATIVE mg/dL
Specific Gravity, Urine: >=1.03 (ref 1.005–1.030)
pH: 5.5 (ref 5.0–8.0)

## 2021-11-06 LAB — URINALYSIS, MICROSCOPIC (REFLEX)

## 2021-11-06 LAB — TROPONIN I (HIGH SENSITIVITY)
Troponin I (High Sensitivity): 2 ng/L (ref <18)
Troponin I (High Sensitivity): 3 ng/L (ref <18)

## 2021-11-06 NOTE — ED Triage Notes (Signed)
Intermittent left sided chest pressure, fatigue, dizziness, and heart palpitations x 1 week. Sent here from [facility] for further eval. Has neg covid/flu test and normal EKG at [facility].

## 2021-11-06 NOTE — Discharge Instructions (Addendum)
You were seen in the emergency room for evaluation of your palpitations, chest pain, and shortness of breath.  Your lab work and chest x-ray were unremarkable.  I likely think this is muscular given that you had chest wall tenderness.  However, given your substantial family history, I would like for you to follow-up with a cardiologist for potential stress test.  I have sent in an ambulatory referral, however if you have not heard back within next few days, please call them to schedule an appointment.  Information will be included in this discharge paperwork.  If you have any concern, new or worsening symptoms, please return to the nearest emergency department for evaluation.  Contact a doctor if: Your chest pain does not go away. You feel depressed. You have a fever. Get help right away if: Your chest pain is worse. You have a cough that gets worse, or you cough up blood. You have very bad (severe) pain in your belly (abdomen). You pass out (faint). You have either of these for no clear reason: Sudden chest discomfort. Sudden discomfort in your arms, back, neck, or jaw. You have shortness of breath at any time. You suddenly start to sweat, or your skin gets clammy. You feel sick to your stomach (nauseous). You throw up (vomit). You suddenly feel lightheaded or dizzy. You feel very weak or tired. Your heart starts to beat fast, or it feels like it is skipping beats. These symptoms may be an emergency. Do not wait to see if the symptoms will go away. Get medical help right away. Call your local emergency services (911 in the [place] Do not drive yourself to the hospital.

## 2021-11-08 NOTE — ED Provider Notes (Signed)
[facility] EMERGENCY DEPARTMENT Provider Note   CSN: [id number] Arrival date & time: 11/06/21  1311     History Chief Complaint  Patient presents with   Chest Pain    Melinda Morales is a 56 y.o. female with history of hypothyroidism presents to the emergency department for evaluation of left-sided chest pain, occasional shortness of breath for the past week.  She was sent here from urgent care for further evaluation.  Negative COVID and flu test at urgent care.  She reports that she has a significant family history of cardiac disease in her immediate and extended family and was concerned given these chest pains.  She denies any heavy lifting or trauma to her chest.  She denies any worsening of the pain on exertion.  Denies any nausea, vomiting, abdominal pain, fevers, cough, lightheadedness, dizziness.   Chest Pain Associated symptoms: palpitations and shortness of breath   Associated symptoms: no abdominal pain, no dizziness, no fever, no nausea and no vomiting        Home Medications Prior to Admission medications   Medication Sig Start Date End Date Taking? Authorizing Provider  ciprofloxacin (CILOXAN) 0.3 % ophthalmic solution Place 2 drops into the right eye 4 (four) times daily. Administer 1 drop, every 2 hours, while awake, for 2 days. Then 1 drop, every 4 hours, while awake, for the next 5 days. 03/19/12   Teressa Lower, NP  fish oil-omega-3 fatty acids 1000 MG capsule Take 2 g by mouth daily.    [provider]  HYDROcodone-acetaminophen (NORCO/VICODIN) 5-325 MG per tablet Take 2 tablets by mouth every 4 (four) hours as needed for pain. 03/19/12   Teressa Lower, NP  levothyroxine (SYNTHROID, LEVOTHROID) 125 MCG tablet Take 125 mcg by mouth daily.    [provider]      Allergies    Hydrocodone-acetaminophen, Septra [sulfamethoxazole-trimethoprim], Oxycodone, and Sulfamethoxazole    Review of Systems   Review of Systems   Constitutional:  Negative for chills and fever.  Respiratory:  Positive for shortness of breath.   Cardiovascular:  Positive for chest pain and palpitations. Negative for leg swelling.  Gastrointestinal:  Negative for abdominal pain, constipation, diarrhea, nausea and vomiting.  Neurological:  Negative for dizziness and light-headedness.    Physical Exam Updated Vital Signs BP (!) 137/101   Pulse 82   Temp 98.1 F (36.7 C) (Oral)   Resp 19   Ht 5' 6.5" (1.689 m)   Wt 81.6 kg   SpO2 98%   BMI 28.62 kg/m  Physical Exam Vitals and nursing note reviewed.  Constitutional:      General: She is not in acute distress.    Appearance: Normal appearance. She is not ill-appearing or toxic-appearing.  HENT:     Head: Normocephalic and atraumatic.  Eyes:     General: No scleral icterus. Cardiovascular:     Rate and Rhythm: Normal rate and regular rhythm.  Pulmonary:     Effort: Pulmonary effort is normal. No respiratory distress.     Breath sounds: Normal breath sounds. No decreased breath sounds.     Comments: Clear to auscultation bilaterally.  No respiratory distress, nasal flaring, tripoding, cyanosis, or accessory muscle use.  She is speaking in full sentences with ease and is satting well on room air without any increased work of breathing. Chest:     Chest wall: Tenderness present. No crepitus.     Comments: Tenderness to the left side of the chest upon palpation mainly to the  left lateral aspect.  I do not feel any crepitus, masses, increased warmth, or any other overlying skin changes.  I did check the axilla for any lymphadenopathy and also performed a breast exam for the left breast.  Nipple appears normal and is not retracted.  I do not feel any immobile knots other than some typical fibrocystic breast changes. Abdominal:     General: Abdomen is flat. Bowel sounds are normal.     Palpations: Abdomen is soft.     Tenderness: There is no abdominal tenderness.  Musculoskeletal:         General: No deformity.     Cervical back: Normal range of motion.  Skin:    General: Skin is warm and dry.  Neurological:     General: No focal deficit present.     Mental Status: She is alert. Mental status is at baseline.     ED Results / Procedures / Treatments   Labs (all labs ordered are listed, but only abnormal results are displayed) Labs Reviewed  BASIC METABOLIC PANEL - Abnormal; Notable for the following components:      Result Value   Glucose, Bld 196 (*)    All other components within normal limits  URINALYSIS, ROUTINE W REFLEX MICROSCOPIC - Abnormal; Notable for the following components:   Hgb urine dipstick SMALL (*)    All other components within normal limits  URINALYSIS, MICROSCOPIC (REFLEX) - Abnormal; Notable for the following components:   Bacteria, UA RARE (*)    All other components within normal limits  CBC  D-DIMER, QUANTITATIVE  TROPONIN I (HIGH SENSITIVITY)  TROPONIN I (HIGH SENSITIVITY)    EKG EKG Interpretation  Date/Time:  Wednesday November 06 2021 13:26:55 EDT Ventricular Rate:  95 PR Interval:  150 QRS Duration: 78 QT Interval:  360 QTC Calculation: 452 R Axis:   90 Text Interpretation: Normal sinus rhythm Rightward axis T wave abnormality, consider inferior ischemia Abnormal ECG No previous ECGs available Confirmed by Dorie Rank [phone]) on 11/06/2021 4:56:57 PM  Radiology DG Chest 2 View  Result Date: 11/06/2021 CLINICAL DATA:  Chest pain for 1 week. EXAM: CHEST - 2 VIEW COMPARISON:  None Available. FINDINGS: Heart size and mediastinal contours are unremarkable. No pleural effusion or edema identified. No airspace opacities identified. Visualized osseous structures are unremarkable. IMPRESSION: No active cardiopulmonary disease. Electronically Signed   By: Kerby Moors M.D.   On: 11/06/2021 14:22     Procedures Procedures   Medications Ordered in ED Medications - No data to display  ED Course/ Medical Decision Making/  A&P                           Medical Decision Making Amount and/or Complexity of Data Reviewed Labs: ordered. Radiology: ordered.   56 year old female presents emerged department for evaluation of chest pain, shortness of breath, and palpitations for the past week.  Differential diagnosis includes was limited to ACS, arrhythmia, PE, pneumonia, pneumothorax, aneurysm, dissection, anxiety, costochondritis, chest wall pain, precordial symptoms.  Vital signs show slightly elevated blood pressure otherwise normal temperature, normal heart rate, satting well on room air with any increased work of breathing.  Physical exam as noted above.  Given the patient's significant family cardiac history although she does not have any personal cardiac history, I will proceed with a chest pain work-up including a D-dimer.  Patient does have some tenderness to her chest wall so I likely think this is musculoskeletal.  I  independently reviewed and interpreted the patient's labs and imaging.  CBC without leukocytosis or anemia.  Urinalysis shows small amount of hemoglobin with rare bacteria although there is 0-5 white blood cells seen.  Patient does not have any urinary symptoms.  BMP shows elevated glucose at 196, patient does not have a personal history of diabetes and follows up with her PCP.  I suggested that she follow-up with her primary care doctor for possible A1c recheck for prediabetes/diabetes.  Troponin at 2 with repeat at 3, delta 1.  D-dimer undetectable.  EKG read interpreted by my attending shows normal sinus rhythm with a rightward axis T wave abnormality, no previous EKGs to confirm this to.  Consider inferior ischemia.  Chest x-ray shows no active cardiopulmonary process.  All although given the EKG, patient's troponins are flat.  Her D-dimer is undetectable so I doubt any ACS or PE.  Chest x-ray did not show any signs of pneumonia or pneumothorax.  This does not sound like an aneurysm or  dissection.  Given the symptoms, again with her family history, I will place an ambulatory referral to cardiology for her to follow-up with.  I discussed the lab and imaging results with the patient at bedside.  We discussed the ambulatory referral to cardiology.  I recommended that they do not call her within the next few days to make sure that she calls them to schedule an appointment.  Recommended Tylenol or ibuprofen as needed for pain.  We discussed strict return precautions and red flag symptoms.  Patient verbalized understanding and agrees to the plan.  Patient is stable being discharged home in good condition.  I discussed this case with my attending physician who cosigned this note including patient's presenting symptoms, physical exam, and planned diagnostics and interventions. Attending physician stated agreement with plan or made changes to plan which were implemented.   Final Clinical Impression(s) / ED Diagnoses Final diagnoses:  Chest wall pain  Nonspecific chest pain  Palpitations    Rx / DC Orders ED Discharge Orders          Ordered    Ambulatory referral to Cardiology       Comments: If you have not heard from the Cardiology office within the next 72 hours please call [phone].   11/06/21 1848              Achille Rich, PA-C 11/11/21 Wille Glaser, MD 11/11/21 [phone]

## 2021-11-12 ENCOUNTER — Ambulatory Visit: Payer: 59 | Attending: Cardiovascular Disease

## 2021-11-12 ENCOUNTER — Ambulatory Visit: Payer: 59 | Attending: Cardiovascular Disease | Admitting: Cardiovascular Disease

## 2021-11-12 ENCOUNTER — Encounter: Payer: Self-pay | Admitting: Cardiovascular Disease

## 2021-11-12 VITALS — BP 120/60 | HR 86 | Ht 67.0 in | Wt 187.4 lb

## 2021-11-12 DIAGNOSIS — R072 Precordial pain: Secondary | ICD-10-CM

## 2021-11-12 DIAGNOSIS — R0789 Other chest pain: Secondary | ICD-10-CM | POA: Diagnosis not present

## 2021-11-12 DIAGNOSIS — R0609 Other forms of dyspnea: Secondary | ICD-10-CM

## 2021-11-12 DIAGNOSIS — R002 Palpitations: Secondary | ICD-10-CM | POA: Diagnosis not present

## 2021-11-12 MED ORDER — METOPROLOL TARTRATE 100 MG PO TABS: 100.0000 mg | ORAL_TABLET | Freq: Once | ORAL | 0 refills | Status: DC

## 2021-11-12 NOTE — Assessment & Plan Note (Signed)
Patient has described palpitations which has awakened her from sleep over the last several weeks.  I am going to get a 2-week Zio patch to further evaluate.

## 2021-11-12 NOTE — Patient Instructions (Signed)
Medication Instructions:  Your physician recommends that you continue on your current medications as directed. Please refer to the Current Medication list given to you today.  *If you need a refill on your cardiac medications before your next appointment, please call your pharmacy*   Lab Work: Your physician recommends that you return for lab work in: Cytogeneticist, BMET, TSH & Free T4   If you have labs (blood work) drawn today and your tests are completely normal, you will receive your results only by: MyChart Message (if you have MyChart) OR A paper copy in the mail If you have any lab test that is abnormal or we need to change your treatment, we will call you to review the results.   Testing/Procedures: Your physician has requested that you have an echocardiogram. Echocardiography is a painless test that uses sound waves to create images of your heart. It provides your doctor with information about the size and shape of your heart and how well your heart's chambers and valves are working. This procedure takes approximately one hour. There are no restrictions for this procedure. Please do NOT wear cologne, perfume, aftershave, or lotions (deodorant is allowed). Please arrive 15 minutes prior to your appointment time. This procedure will be done at [address]   ZIO XT- Long Term Monitor Instructions  Your physician has requested you wear a ZIO patch monitor for 14 days.  This is a single patch monitor. [employer] supplies one patch monitor per enrollment. Additional stickers are not available. Please do not apply patch if you will be having a Nuclear Stress Test,  Echocardiogram, Cardiac CT, MRI, or Chest Xray during the period you would be wearing the  monitor. The patch cannot be worn during these tests. You cannot remove and re-apply the  ZIO XT patch monitor.  Your ZIO patch monitor will be mailed 3 day USPS to your address on file. It may take 3-5 days   to receive your monitor after you have been enrolled.  Once you have received your monitor, please review the enclosed instructions. Your monitor  has already been registered assigning a specific monitor serial # to you.  Billing and Patient Assistance Program Information  We have supplied [employer] with any of your insurance information on file for billing purposes. [employer] offers a sliding scale Patient Assistance Program for patients that do not have  insurance, or whose insurance does not completely cover the cost of the ZIO monitor.  You must apply for the Patient Assistance Program to qualify for this discounted rate.  To apply, please call [employer] at [phone], select option 4, select option 2, ask to apply for  Patient Assistance Program. Meredeth Ide will ask your household income, and how many people  are in your household. They will quote your out-of-pocket cost based on that information.  [employer] will also be able to set up a 16-month, interest-free payment plan if needed.  Applying the monitor   Shave hair from upper left chest.  Hold abrader disc by orange tab. Rub abrader in 40 strokes over the upper left chest as  indicated in your monitor instructions.  Clean area with 4 enclosed alcohol pads. Let dry.  Apply patch as indicated in monitor instructions. Patch will be placed under collarbone on left  side of chest with arrow pointing upward.  Rub patch adhesive wings for 2 minutes. Remove white label marked "1". Remove the white  label marked "2". Rub patch adhesive wings for 2  additional minutes.  While looking in a mirror, press and release button in center of patch. A small green light will  flash 3-4 times. This will be your only indicator that the monitor has been turned on.  Do not shower for the first 24 hours. You may shower after the first 24 hours.  Press the button if you feel a symptom. You will hear a small click. Record Date, Time and  Symptom in the Patient  Logbook.  When you are ready to remove the patch, follow instructions on the last 2 pages of Patient  Logbook. Stick patch monitor onto the last page of Patient Logbook.  Place Patient Logbook in the blue and white box. Use locking tab on box and tape box closed  securely. The blue and white box has prepaid postage on it. Please place it in the mailbox as  soon as possible. Your physician should have your test results approximately 7 days after the  monitor has been mailed back to [facility].  Call [place] at [phone] if you have questions regarding  your ZIO XT patch monitor. Call them immediately if you see an orange light blinking on your  monitor.  If your monitor falls off in less than 4 days, contact our Monitor department at [phone].  If your monitor becomes loose or falls off after 4 days call [employer] at [phone] for  suggestions on securing your monitor    Follow-Up: At [facility], you and your health needs are our priority.  As part of our continuing mission to provide you with exceptional heart care, we have created designated Provider Care Teams.  These Care Teams include your primary Cardiologist (physician) and Advanced Practice Providers (APPs -  Physician Assistants and Nurse Practitioners) who all work together to provide you with the care you need, when you need it.  We recommend signing up for the patient portal called "MyChart".  Sign up information is provided on this After Visit Summary.  MyChart is used to connect with patients for Virtual Visits (Telemedicine).  Patients are able to view lab/test results, encounter notes, upcoming appointments, etc.  Non-urgent messages can be sent to your provider as well.   To learn more about what you can do with MyChart, go to [email or web address].    Your next appointment:   5-6 week(s)  The format for your next appointment:   In Person  Provider:   Quay Burow, MD     Other Instructions   Your cardiac CT will be scheduled at the below location:   [facility] [address] [place], [place] [address] [phone]   If scheduled at [facility], please arrive at the [facility] Entrance (Entrance C2) of [facility] 30 minutes prior to test start time. You can use the FREE valet parking offered at entrance C (encouraged to control the heart rate for the test)  Proceed to the [facility] Radiology Department (first floor) to check-in and test prep.  All radiology patients and guests should use entrance C2 at [facility], accessed from [facility], even though the hospital's physical address listed is [address]     Please follow these instructions carefully (unless otherwise directed):   On the Night Before the Test: Be sure to Drink plenty of water. Do not consume any caffeinated/decaffeinated beverages or chocolate 12 hours prior to your test. Do not take any antihistamines 12 hours prior to your test.  On the Day of the Test: Drink plenty of water until 1 hour prior to the test. Do not eat any food 1 hour prior to test. You may take your regular medications prior to the test.  Take metoprolol (Lopressor) 100mg  two hours prior to test. HOLD Furosemide/Hydrochlorothiazide morning of the test.       After the Test: Drink plenty of water. After receiving IV contrast, you may experience a mild flushed feeling. This is normal. On occasion, you may experience a mild rash up to 24 hours after the test. This is not dangerous. If this occurs, you can take Benadryl 25 mg and increase your fluid intake. If you experience trouble breathing, this can be serious. If it is severe call 911 IMMEDIATELY. If it is mild, please call our office. If you take any of these medications: Glipizide/Metformin, Avandament, Glucavance, please do not take 48 hours after completing test unless  otherwise instructed.  We will call to schedule your test 2-4 weeks out understanding that some insurance companies will need an authorization prior to the service being performed.   For non-scheduling related questions, please contact the cardiac imaging nurse navigator should you have any questions/concerns: , Cardiac Imaging Nurse Navigator [place], Cardiac Imaging Nurse Navigator [facility] Direct Office Dial: [phone]   For scheduling needs, including cancellations and rescheduling, please call [phone], [phone].

## 2021-11-12 NOTE — Progress Notes (Signed)
11/12/2021 Melinda Morales   1965/11/30  [id number]  Primary Physician Melinda School, NP Primary Cardiologist: Melinda Gess MD Melinda Morales, Melinda Morales  HPI:  Melinda Morales is a 56 y.o. mildly overweight married Caucasian female with no children who was referred by the ER for chest pain after recent evaluation in 11/06/2021.  She is a Product manager.  She has no risk factors for ischemic heart disease.  Her mother did have a myocardial infarction however this was at age 70.  She is never had heart attack or stroke.  She did break her ankle in August and wore a boot for a while.  Over the last several weeks she has been awakened from sleep with palpitations as described atypical left-sided chest pain with dyspnea on exertion.  She was seen in the ER 11/06/2021 with the symptoms and had a negative work-up at that time.   Current Meds  Medication Sig   Calcium Carbonate (CALCIUM 500 PO) Take 500 mg by mouth.   cetirizine (ZYRTEC) 10 MG tablet Take 10 mg by mouth daily.   Cholecalciferol (VITAMIN D-1000 MAX ST) 25 MCG (1000 UT) tablet Take by mouth.   fish oil-omega-3 fatty acids 1000 MG capsule Take 2 g by mouth daily.   levothyroxine (SYNTHROID, LEVOTHROID) 125 MCG tablet Take 125 mcg by mouth daily.   Multiple Vitamin (MULTI-VITAMIN) tablet Take 1 tablet by mouth daily.     Allergies  Allergen Reactions   Hydrocodone-Acetaminophen Nausea Only   Septra [Sulfamethoxazole-Trimethoprim] Hives   Oxycodone Nausea Only   Sulfamethoxazole Rash    Social History   Socioeconomic History   Marital status: Married    Spouse name: Not on file   Number of children: Not on file   Years of education: Not on file   Highest education level: Not on file  Occupational History   Not on file  Tobacco Use   Smoking status: Never   Smokeless tobacco: Never  Vaping Use   Vaping Use: Never used  Substance and Sexual Activity   Alcohol use: No   Drug use: No   Sexual  activity: Never    Birth control/protection: None  Other Topics Concern   Not on file  Social History Narrative   Not on file   Social Determinants of Health   Financial Resource Strain: Not on file  Food Insecurity: Not on file  Transportation Needs: Not on file  Physical Activity: Not on file  Stress: Not on file  Social Connections: Not on file  Intimate Partner Violence: Not on file     Review of Systems: General: negative for chills, fever, night sweats or weight changes.  Cardiovascular: negative for chest pain, dyspnea on exertion, edema, orthopnea, palpitations, paroxysmal nocturnal dyspnea or shortness of breath Dermatological: negative for rash Respiratory: negative for cough or wheezing Urologic: negative for hematuria Abdominal: negative for nausea, vomiting, diarrhea, bright red blood per rectum, melena, or hematemesis Neurologic: negative for visual changes, syncope, or dizziness All other systems reviewed and are otherwise negative except as noted above.    Blood pressure 120/60, pulse 86, height 5\' 7"  (1.702 m), weight 187 lb 6.4 oz (85 kg), SpO2 93 %.  General appearance: alert and no distress Neck: no adenopathy, no carotid bruit, no JVD, supple, symmetrical, trachea midline, and thyroid not enlarged, symmetric, no tenderness/mass/nodules Lungs: clear to auscultation bilaterally Heart: regular rate and rhythm, S1, S2 normal, no murmur, click, rub or gallop Extremities: extremities  normal, atraumatic, no cyanosis or edema Pulses: 2+ and symmetric Skin: Skin color, texture, turgor normal. No rashes or lesions Neurologic: Grossly normal  EKG sinus rhythm at 86 with poor R wave progression.  Personally reviewed this EKG.  ASSESSMENT AND PLAN:   Atypical chest pain [name] was referred by the emergency room for atypical chest pain.  She was recently seen on 11/06/2021 with left lateral chest pain.  Her work-up was unrevealing.  Her pain was off and on for  several hours.  She really has no cardiac risk factors.  I am going to order a coronary CTA to further evaluate.  Palpitations Patient has described palpitations which has awakened her from sleep over the last several weeks.  I am going to get a 2-week Zio patch to further evaluate.  Dyspnea on exertion Patient describes increased dyspnea on exertion over the last several weeks doing normal activity.  We will check a 2D echo to further evaluate.     Melinda Harp MD FACP,FACC,FAHA, [facility] 11/12/2021 2:26 PM

## 2021-11-12 NOTE — Assessment & Plan Note (Signed)
Melinda Morales was referred by the emergency room for atypical chest pain.  She was recently seen on 11/06/2021 with left lateral chest pain.  Her work-up was unrevealing.  Her pain was off and on for several hours.  She really has no cardiac risk factors.  I am going to order a coronary CTA to further evaluate.

## 2021-11-12 NOTE — Assessment & Plan Note (Signed)
Patient describes increased dyspnea on exertion over the last several weeks doing normal activity.  We will check a 2D echo to further evaluate.

## 2021-11-12 NOTE — Progress Notes (Unsigned)
Enrolled for [employer] to mail a ZIO XT long term holter monitor to the patients address on file.  

## 2021-11-13 LAB — LIPID PANEL
Chol/HDL Ratio: 3.3 ratio (ref 0.0–4.4)
Cholesterol, Total: 231 mg/dL — ABNORMAL HIGH (ref 100–199)
HDL: 70 mg/dL (ref >39)
LDL Chol Calc (NIH): 140 mg/dL — ABNORMAL HIGH (ref 0–99)
Triglycerides: 118 mg/dL (ref 0–149)
VLDL Cholesterol Cal: 21 mg/dL (ref 5–40)

## 2021-11-13 LAB — BASIC METABOLIC PANEL
BUN/Creatinine Ratio: 16 (ref 9–23)
BUN: 15 mg/dL (ref 6–24)
CO2: 26 mmol/L (ref 20–29)
Calcium: 9.7 mg/dL (ref 8.7–10.2)
Chloride: 104 mmol/L (ref 96–106)
Creatinine, Ser: 0.95 mg/dL (ref 0.57–1.00)
Glucose: 91 mg/dL (ref 70–99)
Potassium: 4.3 mmol/L (ref 3.5–5.2)
Sodium: 144 mmol/L (ref 134–144)
eGFR: 70 mL/min/{1.73_m2} (ref >59)

## 2021-11-13 LAB — HEPATIC FUNCTION PANEL
ALT: 13 IU/L (ref 0–32)
AST: 15 IU/L (ref 0–40)
Albumin: 4.8 g/dL (ref 3.8–4.9)
Alkaline Phosphatase: 73 IU/L (ref 44–121)
Bilirubin Total: 0.4 mg/dL (ref 0.0–1.2)
Bilirubin, Direct: 0.12 mg/dL (ref 0.00–0.40)
Total Protein: 7.3 g/dL (ref 6.0–8.5)

## 2021-11-13 LAB — TSH: TSH: 7.02 u[IU]/mL — ABNORMAL HIGH (ref 0.450–4.500)

## 2021-11-13 NOTE — Addendum Note (Signed)
Addended by: Waylan Rocher on: 11/13/2021 09:14 AM   Modules accepted: Orders

## 2021-11-15 DIAGNOSIS — R072 Precordial pain: Secondary | ICD-10-CM | POA: Diagnosis not present

## 2021-11-15 DIAGNOSIS — R0789 Other chest pain: Secondary | ICD-10-CM | POA: Diagnosis not present

## 2021-11-15 DIAGNOSIS — R0609 Other forms of dyspnea: Secondary | ICD-10-CM

## 2021-11-15 DIAGNOSIS — R002 Palpitations: Secondary | ICD-10-CM | POA: Diagnosis not present

## 2021-11-25 ENCOUNTER — Other Ambulatory Visit (HOSPITAL_COMMUNITY): Payer: 59

## 2021-11-29 ENCOUNTER — Other Ambulatory Visit (HOSPITAL_COMMUNITY): Payer: 59

## 2021-12-04 ENCOUNTER — Telehealth (HOSPITAL_COMMUNITY): Payer: Self-pay | Admitting: Emergency Medicine

## 2021-12-04 NOTE — Telephone Encounter (Signed)
Reaching out to patient to offer assistance regarding upcoming cardiac imaging study; pt verbalizes understanding of appt date/time, parking situation and where to check in, pre-test NPO status and medications ordered, and verified current allergies; name and call back number provided for further questions should they arise Rockwell Alexandria RN Navigator Cardiac Imaging [facility] [phone] office [phone] cell  Arrival 1200 [address] entrance 100mg  metoprolol tartrate Hold allergy meds

## 2021-12-05 ENCOUNTER — Other Ambulatory Visit: Payer: Self-pay

## 2021-12-05 DIAGNOSIS — E785 Hyperlipidemia, unspecified: Secondary | ICD-10-CM

## 2021-12-06 ENCOUNTER — Telehealth: Payer: Self-pay | Admitting: Cardiovascular Disease

## 2021-12-06 ENCOUNTER — Ambulatory Visit (HOSPITAL_COMMUNITY)
Admission: RE | Admit: 2021-12-06 | Discharge: 2021-12-06 | Disposition: A | Discharge status: Home / Self Care | Payer: 59 | Source: Ambulatory Visit | Attending: Cardiovascular Disease | Admitting: Cardiovascular Disease

## 2021-12-06 ENCOUNTER — Ambulatory Visit (HOSPITAL_BASED_OUTPATIENT_CLINIC_OR_DEPARTMENT_OTHER): Payer: 59

## 2021-12-06 DIAGNOSIS — R0609 Other forms of dyspnea: Secondary | ICD-10-CM

## 2021-12-06 DIAGNOSIS — R002 Palpitations: Secondary | ICD-10-CM | POA: Diagnosis not present

## 2021-12-06 DIAGNOSIS — R072 Precordial pain: Secondary | ICD-10-CM

## 2021-12-06 DIAGNOSIS — R0789 Other chest pain: Secondary | ICD-10-CM | POA: Diagnosis present

## 2021-12-06 DIAGNOSIS — E785 Hyperlipidemia, unspecified: Secondary | ICD-10-CM

## 2021-12-06 LAB — ECHOCARDIOGRAM COMPLETE
Area-P 1/2: 3.37 cm2
S' Lateral: 2.7 cm

## 2021-12-06 MED ORDER — METOPROLOL TARTRATE 5 MG/5ML IV SOLN
INTRAVENOUS | Status: AC
  Administered 2021-12-06: 10 mg via INTRAVENOUS
  Filled 2021-12-06: qty 10

## 2021-12-06 MED ORDER — NITROGLYCERIN 0.4 MG SL SUBL
SUBLINGUAL_TABLET | SUBLINGUAL | Status: AC
  Filled 2021-12-06: qty 2

## 2021-12-06 MED ORDER — METOPROLOL TARTRATE 5 MG/5ML IV SOLN: 10.0000 mg | INTRAVENOUS | Status: DC | PRN

## 2021-12-06 MED ORDER — ATORVASTATIN CALCIUM 20 MG PO TABS: 20.0000 mg | ORAL_TABLET | Freq: Every day | ORAL | 3 refills | Status: DC

## 2021-12-06 MED ORDER — IOHEXOL 350 MG/ML SOLN
100.0000 mL | Freq: Once | INTRAVENOUS | Status: AC | PRN
  Administered 2021-12-06: 100 mL via INTRAVENOUS

## 2021-12-06 MED ORDER — NITROGLYCERIN 0.4 MG SL SUBL
0.8000 mg | SUBLINGUAL_TABLET | Freq: Once | SUBLINGUAL | Status: AC
  Administered 2021-12-06: 0.8 mg via SUBLINGUAL

## 2021-12-06 NOTE — Telephone Encounter (Signed)
Patient returned RN's call. 

## 2021-12-06 NOTE — Telephone Encounter (Signed)
Spoke with patient to inform her of new prescription atorvastatin 20mg  daily. Order placed. Lab order for fasting lipids in 3 months mailed to her.

## 2022-01-01 ENCOUNTER — Encounter: Payer: Self-pay | Admitting: Cardiovascular Disease

## 2022-01-01 ENCOUNTER — Ambulatory Visit: Payer: 59 | Attending: Cardiovascular Disease | Admitting: Cardiovascular Disease

## 2022-01-01 VITALS — BP 136/88 | HR 85 | Ht 67.0 in | Wt 182.4 lb

## 2022-01-01 DIAGNOSIS — G4733 Obstructive sleep apnea (adult) (pediatric): Secondary | ICD-10-CM

## 2022-01-01 DIAGNOSIS — I471 Supraventricular tachycardia, unspecified: Secondary | ICD-10-CM

## 2022-01-01 NOTE — Patient Instructions (Signed)
Medication Instructions:  Your physician recommends that you continue on your current medications as directed. Please refer to the Current Medication list given to you today.  *If you need a refill on your cardiac medications before your next appointment, please call your pharmacy*   Lab Work: Your physician recommends that you return for lab work in: February for FASTING lipid panel  If you have labs (blood work) drawn today and your tests are completely normal, you will receive your results only by: [employer] (if you have MyChart) OR A paper copy in the mail If you have any lab test that is abnormal or we need to change your treatment, we will call you to review the results.   Testing/Procedures: Your physician has recommended that you have a sleep study. This test records several body functions during sleep, including: brain activity, eye movement, oxygen and carbon dioxide blood levels, heart rate and rhythm, breathing rate and rhythm, the flow of air through your mouth and nose, snoring, body muscle movements, and chest and belly movement. You will receive a call to schedule this.     Follow-Up: At [facility], you and your health needs are our priority.  As part of our continuing mission to provide you with exceptional heart care, we have created designated Provider Care Teams.  These Care Teams include your primary Cardiologist (physician) and Advanced Practice Providers (APPs -  Physician Assistants and Nurse Practitioners) who all work together to provide you with the care you need, when you need it.  We recommend signing up for the patient portal called "MyChart".  Sign up information is provided on this After Visit Summary.  MyChart is used to connect with patients for Virtual Visits (Telemedicine).  Patients are able to view lab/test results, encounter notes, upcoming appointments, etc.  Non-urgent messages can be sent to your provider as well.   To learn more  about what you can do with MyChart, go to [email or web address].    Your next appointment:   3 month(s)  The format for your next appointment:   In Person  Provider:   Nanetta Batty, MD

## 2022-01-01 NOTE — Assessment & Plan Note (Signed)
Patient has nocturnal snoring and daytime somnolence.  Her Epworth score is 7.  We will pursue outpatient sleep study.

## 2022-01-01 NOTE — Progress Notes (Signed)
Melinda Morales returns today for follow-up of her outpatient studies.  She is accompanied by her husband Melinda Morales today.  She had a 2D echocardiogram that revealed normal LV systolic function and no valvular abnormalities.  A coronary CTA showed a coronary calcium score 0 with 0 obstructive disease.  An event monitor showed short runs of SVT up to 200 bpm longest lasting 16 beats.  She does drink a significant mount of caffeine.  We talked about caffeine avoidance.  We will readdress in 3 months.  If she still having symptoms I suggested beginning a low-dose beta-blocker.  She also has symptoms of obstructive sleep apnea which may be contributing.  I suggested that she undergo an outpatient sleep study.  She does have nocturnal snoring and daytime somnolence.  Her Epworth score was 7.  I began her on atorvastatin 2 months ago.  Open we will recheck a fasting lipid liver profile early next year.  Runell Gess, M.D., FACP, [facility], [facility] [address] [place], [place]  [address]  [phone] 01/01/2022 4:07 PM

## 2022-03-17 LAB — LIPID PANEL
Chol/HDL Ratio: 2.4 ratio (ref 0.0–4.4)
Cholesterol, Total: 142 mg/dL (ref 100–199)
HDL: 58 mg/dL (ref >39)
LDL Chol Calc (NIH): 71 mg/dL (ref 0–99)
Triglycerides: 64 mg/dL (ref 0–149)
VLDL Cholesterol Cal: 13 mg/dL (ref 5–40)

## 2022-03-24 ENCOUNTER — Encounter: Payer: Self-pay | Admitting: Family Medicine

## 2022-03-24 ENCOUNTER — Encounter: Payer: Self-pay | Admitting: Gastroenterology

## 2022-03-24 ENCOUNTER — Ambulatory Visit: Payer: 59 | Admitting: Family Medicine

## 2022-03-24 VITALS — BP 122/82 | HR 85 | Temp 97.9°F | Ht 67.0 in | Wt 173.4 lb

## 2022-03-24 DIAGNOSIS — Z780 Asymptomatic menopausal state: Secondary | ICD-10-CM | POA: Diagnosis not present

## 2022-03-24 DIAGNOSIS — E7800 Pure hypercholesterolemia, unspecified: Secondary | ICD-10-CM

## 2022-03-24 DIAGNOSIS — Z01419 Encounter for gynecological examination (general) (routine) without abnormal findings: Secondary | ICD-10-CM | POA: Diagnosis not present

## 2022-03-24 DIAGNOSIS — Z1211 Encounter for screening for malignant neoplasm of colon: Secondary | ICD-10-CM | POA: Diagnosis not present

## 2022-03-24 DIAGNOSIS — E039 Hypothyroidism, unspecified: Secondary | ICD-10-CM

## 2022-03-24 LAB — COMPREHENSIVE METABOLIC PANEL
ALT: 22 U/L (ref 0–35)
AST: 18 U/L (ref 0–37)
Albumin: 4.6 g/dL (ref 3.5–5.2)
Alkaline Phosphatase: 77 U/L (ref 39–117)
BUN: 14 mg/dL (ref 6–23)
CO2: 28 mEq/L (ref 19–32)
Calcium: 10.1 mg/dL (ref 8.4–10.5)
Chloride: 105 mEq/L (ref 96–112)
Creatinine, Ser: 0.83 mg/dL (ref 0.40–1.20)
GFR: 77.84 mL/min (ref >60.00)
Glucose, Bld: 87 mg/dL (ref 70–99)
Potassium: 4.1 mEq/L (ref 3.5–5.1)
Sodium: 143 mEq/L (ref 135–145)
Total Bilirubin: 0.7 mg/dL (ref 0.2–1.2)
Total Protein: 7.4 g/dL (ref 6.0–8.3)

## 2022-03-24 LAB — URINALYSIS, ROUTINE W REFLEX MICROSCOPIC
Bilirubin Urine: NEGATIVE
Ketones, ur: NEGATIVE
Leukocytes,Ua: NEGATIVE
Nitrite: NEGATIVE
Specific Gravity, Urine: 1.025 (ref 1.000–1.030)
Total Protein, Urine: NEGATIVE
Urine Glucose: NEGATIVE
Urobilinogen, UA: 0.2 (ref 0.0–1.0)
pH: 5.5 (ref 5.0–8.0)

## 2022-03-24 LAB — LIPID PANEL
Cholesterol: 146 mg/dL (ref 0–200)
HDL: 56.4 mg/dL (ref >39.00)
LDL Cholesterol: 75 mg/dL (ref 0–99)
NonHDL: 89.87
Total CHOL/HDL Ratio: 3
Triglycerides: 76 mg/dL (ref 0.0–149.0)
VLDL: 15.2 mg/dL (ref 0.0–40.0)

## 2022-03-24 LAB — CBC WITH DIFFERENTIAL/PLATELET
Basophils Absolute: 0 10*3/uL (ref 0.0–0.1)
Basophils Relative: 1 % (ref 0.0–3.0)
Eosinophils Absolute: 0.1 10*3/uL (ref 0.0–0.7)
Eosinophils Relative: 2.8 % (ref 0.0–5.0)
HCT: 45.7 % (ref 36.0–46.0)
Hemoglobin: 15.3 g/dL — ABNORMAL HIGH (ref 12.0–15.0)
Lymphocytes Relative: 35.7 % (ref 12.0–46.0)
Lymphs Abs: 1.2 10*3/uL (ref 0.7–4.0)
MCHC: 33.5 g/dL (ref 30.0–36.0)
MCV: 89.2 fl (ref 78.0–100.0)
Monocytes Absolute: 0.2 10*3/uL (ref 0.1–1.0)
Monocytes Relative: 5.7 % (ref 3.0–12.0)
Neutro Abs: 1.8 10*3/uL (ref 1.4–7.7)
Neutrophils Relative %: 54.8 % (ref 43.0–77.0)
Platelets: 262 10*3/uL (ref 150.0–400.0)
RBC: 5.12 Mil/uL — ABNORMAL HIGH (ref 3.87–5.11)
RDW: 13.4 % (ref 11.5–15.5)
WBC: 3.3 10*3/uL — ABNORMAL LOW (ref 4.0–10.5)

## 2022-03-24 LAB — TSH: TSH: 0.81 u[IU]/mL (ref 0.35–5.50)

## 2022-03-24 NOTE — Progress Notes (Signed)
Established Patient Office Visit   Subjective:  Patient ID: Melinda Morales, female    DOB: 1965/07/20  Age: 57 y.o. MRN: [id number]  Chief Complaint  Patient presents with   Establish Care    NP/establish care no concerns. Patient fasting.     HPI for reestablishment of care.  Reminds me that she was my patient 10 years ago.  Has since developed palpitations.  Wore a ZIO monitor and significant runs of V. tach were noted.  She is also seeing cardiology for this issue.  She has been able to lose weight and discontinue caffeine.  Work and life continues to be stressful for her.  Primary caregiver of her 26 year old mother.   Encounter Diagnoses  Name Primary?   Screening for colon cancer Yes   Postmenopausal estrogen deficiency    Periodic health assessment, Pap and pelvic    Hypothyroidism, unspecified type    Elevated LDL cholesterol level       Review of Systems  Constitutional: Negative.   HENT: Negative.    Eyes:  Negative for blurred vision, discharge and redness.  Respiratory: Negative.  Negative for shortness of breath.   Cardiovascular:  Positive for palpitations. Negative for chest pain.  Gastrointestinal:  Negative for abdominal pain.  Genitourinary: Negative.   Musculoskeletal: Negative.  Negative for myalgias.  Skin:  Negative for rash.  Neurological:  Negative for tingling, loss of consciousness and weakness.  Endo/Heme/Allergies:  Negative for polydipsia.     Current Outpatient Medications:    atorvastatin (LIPITOR) 20 MG tablet, Take 1 tablet (20 mg total) by mouth daily., Disp: 90 tablet, Rfl: 3   Calcium Carbonate (CALCIUM 500 PO), Take 500 mg by mouth., Disp: , Rfl:    cetirizine (ZYRTEC) 10 MG tablet, Take 10 mg by mouth daily., Disp: , Rfl:    Cholecalciferol (VITAMIN D-1000 MAX ST) 25 MCG (1000 UT) tablet, Take by mouth., Disp: , Rfl:    fluticasone (FLONASE) 50 MCG/ACT nasal spray, Place 2 sprays into both nostrils daily., Disp: , Rfl:     levothyroxine (SYNTHROID, LEVOTHROID) 125 MCG tablet, Take 125 mcg by mouth daily., Disp: , Rfl:    Multiple Vitamin (MULTI-VITAMIN) tablet, Take 1 tablet by mouth daily., Disp: , Rfl:    fish oil-omega-3 fatty acids 1000 MG capsule, Take 2 g by mouth daily. (Patient not taking: Reported on 03/24/2022), Disp: , Rfl:    Objective:     BP 122/82 (BP Location: Left Arm, Patient Position: Sitting, Cuff Size: Normal)   Pulse 85   Temp 97.9 F (36.6 C) (Temporal)   Ht '5\' 7"'$  (1.702 m)   Wt 173 lb 6.4 oz (78.7 kg)   SpO2 99%   BMI 27.16 kg/m    Physical Exam Constitutional:      General: She is not in acute distress.    Appearance: Normal appearance. She is not ill-appearing, toxic-appearing or diaphoretic.  HENT:     Head: Normocephalic and atraumatic.     Right Ear: External ear normal.     Left Ear: External ear normal.     Mouth/Throat:     Mouth: Mucous membranes are moist.     Pharynx: Oropharynx is clear. No oropharyngeal exudate or posterior oropharyngeal erythema.   Eyes:     General: No scleral icterus.       Right eye: No discharge.        Left eye: No discharge.     Extraocular Movements: Extraocular movements intact.  Conjunctiva/sclera: Conjunctivae normal.     Pupils: Pupils are equal, round, and reactive to light.  Cardiovascular:     Rate and Rhythm: Normal rate and regular rhythm.  Pulmonary:     Effort: Pulmonary effort is normal. No respiratory distress.     Breath sounds: Normal breath sounds. No wheezing or rales.  Musculoskeletal:     Cervical back: No rigidity or tenderness.  Lymphadenopathy:     Cervical: No cervical adenopathy.  Skin:    General: Skin is warm and dry.  Neurological:     Mental Status: She is alert and oriented to person, place, and time.  Psychiatric:        Mood and Affect: Mood normal.        Behavior: Behavior normal.      No results found for any visits on 03/24/22.    The 10-year ASCVD risk score (Arnett DK, et  al., 2019) is: 1.7%    Assessment & Plan:   Screening for colon cancer -     Ambulatory referral to Gastroenterology  Postmenopausal estrogen deficiency -     Ambulatory referral to Obstetrics / Gynecology -     DG Bone Density; Future  Periodic health assessment, Pap and pelvic -     Ambulatory referral to Obstetrics / Gynecology  Hypothyroidism, unspecified type -     CBC with Differential/Platelet -     TSH -     Urinalysis, Routine w reflex microscopic  Elevated LDL cholesterol level -     Comprehensive metabolic panel -     Lipid panel    Return Recommended follow-up pends results of labs today..   As above.  Continue follow-up with Dr. Claiborne Billings for cardiac arrhythmia.  Reiterated the importance of taking levothyroxine on a fasting stomach 30 minutes before eating Libby Maw, MD

## 2022-03-26 ENCOUNTER — Telehealth: Payer: Self-pay

## 2022-03-26 MED ORDER — LEVOTHYROXINE SODIUM 112 MCG PO TABS: 112.0000 ug | ORAL_TABLET | Freq: Every day | ORAL | 0 refills | Status: DC

## 2022-03-26 NOTE — Telephone Encounter (Signed)
Called patient went over lab results and recommendations. Per patient she would like to try a lowered dose of thyroid medication if this could be sent to pharmacy patient would also like to know when should she come back to have recheck?

## 2022-03-26 NOTE — Addendum Note (Signed)
Addended by: Abelino Derrick A on: 03/26/2022 12:00 PM   Modules accepted: Orders

## 2022-03-31 NOTE — Telephone Encounter (Signed)
Patient aware of message below.

## 2022-04-03 ENCOUNTER — Encounter: Payer: Self-pay | Admitting: Internal Medicine

## 2022-04-09 ENCOUNTER — Ambulatory Visit: Payer: 59 | Attending: Cardiovascular Disease | Admitting: Cardiovascular Disease

## 2022-04-09 ENCOUNTER — Encounter: Payer: Self-pay | Admitting: Cardiovascular Disease

## 2022-04-09 VITALS — BP 126/80 | HR 86 | Ht 67.0 in | Wt 174.0 lb

## 2022-04-09 DIAGNOSIS — R0789 Other chest pain: Secondary | ICD-10-CM

## 2022-04-09 DIAGNOSIS — E782 Mixed hyperlipidemia: Secondary | ICD-10-CM | POA: Diagnosis not present

## 2022-04-09 DIAGNOSIS — E785 Hyperlipidemia, unspecified: Secondary | ICD-10-CM | POA: Insufficient documentation

## 2022-04-09 DIAGNOSIS — R002 Palpitations: Secondary | ICD-10-CM | POA: Diagnosis not present

## 2022-04-09 MED ORDER — METOPROLOL SUCCINATE ER 25 MG PO TB24: 12.5000 mg | ORAL_TABLET | Freq: Every day | ORAL | 3 refills | Status: DC

## 2022-04-09 NOTE — Assessment & Plan Note (Signed)
History of palpitations with Zio patch that revealed occasional PACs, PVCs and 7 short runs of SVT.  She has reduced her caffeine intake and has lost 17 pounds with some improvement.  I am going to start her on Toprol-XL 12.5 mg a day.

## 2022-04-09 NOTE — Assessment & Plan Note (Signed)
History of atypical chest pain in the past with coronary CTA performed 12/06/2021 revealing a coronary calcium score 0 with no evidence of obstructive plaque.  Her 2D echo was normal as well.

## 2022-04-09 NOTE — Progress Notes (Signed)
04/09/2022 Melinda Morales   1965-05-10  [id number]  Primary Physician Libby Maw, MD Primary Cardiologist: Lorretta Harp MD FACP, [place], [place], [place]  HPI:  Melinda Morales is a 57 y.o.  mildly overweight married Caucasian female with no children who was referred by the ER for chest pain after recent evaluation in 11/06/2021.  She is a Chartered certified accountant.  I last saw her in the office 01/01/2022.  She has no risk factors for ischemic heart disease.  Her mother did have a myocardial infarction however this was at age 80.  She is never had heart attack or stroke.  She did break her ankle in August and wore a boot for a while.  Over the last several weeks she has been awakened from sleep with palpitations as described atypical left-sided chest pain with dyspnea on exertion.  She was seen in the ER 11/06/2021 with the symptoms and had a negative work-up at that time.  I obtained a coronary CTA on her 12/06/2021 which revealed a coronary calcium score of 0 without obstructive disease.  A Zio patch revealed occasional PACs, PVCs and short runs of SVT.  2D echo was normal.  Since I saw her she has lost 17 pounds on weight watchers and reduced her caffeine intake significantly.  Her chest pain has significantly improved but she is still having palpitations.     Current Meds  Medication Sig   atorvastatin (LIPITOR) 20 MG tablet Take 1 tablet (20 mg total) by mouth daily.   Calcium Carbonate (CALCIUM 500 PO) Take 500 mg by mouth.   cetirizine (ZYRTEC) 10 MG tablet Take 10 mg by mouth daily.   Cholecalciferol (VITAMIN D-1000 MAX ST) 25 MCG (1000 UT) tablet Take by mouth.   fish oil-omega-3 fatty acids 1000 MG capsule Take 2 g by mouth daily.   fluticasone (FLONASE) 50 MCG/ACT nasal spray Place 2 sprays into both nostrils daily.   levothyroxine (SYNTHROID) 112 MCG tablet Take 1 tablet (112 mcg total) by mouth daily.   metoprolol succinate (TOPROL XL) 25 MG 24 hr tablet Take  0.5 tablets (12.5 mg total) by mouth daily.   Multiple Vitamin (MULTI-VITAMIN) tablet Take 1 tablet by mouth daily.     Allergies  Allergen Reactions   Hydrocodone-Acetaminophen Nausea Only   Septra [Sulfamethoxazole-Trimethoprim] Hives   Oxycodone Nausea Only   Sulfamethoxazole Rash    Social History   Socioeconomic History   Marital status: Married    Spouse name: Not on file   Number of children: Not on file   Years of education: Not on file   Highest education level: Not on file  Occupational History   Not on file  Tobacco Use   Smoking status: Never   Smokeless tobacco: Never  Vaping Use   Vaping Use: Never used  Substance and Sexual Activity   Alcohol use: No   Drug use: No   Sexual activity: Never    Birth control/protection: None  Other Topics Concern   Not on file  Social History Narrative   Not on file   Social Determinants of Health   Financial Resource Strain: Not on file  Food Insecurity: Not on file  Transportation Needs: Not on file  Physical Activity: Not on file  Stress: Not on file  Social Connections: Not on file  Intimate Partner Violence: Not on file     Review of Systems: General: negative for chills, fever, night sweats or weight changes.  Cardiovascular:  negative for chest pain, dyspnea on exertion, edema, orthopnea, palpitations, paroxysmal nocturnal dyspnea or shortness of breath Dermatological: negative for rash Respiratory: negative for cough or wheezing Urologic: negative for hematuria Abdominal: negative for nausea, vomiting, diarrhea, bright red blood per rectum, melena, or hematemesis Neurologic: negative for visual changes, syncope, or dizziness All other systems reviewed and are otherwise negative except as noted above.    Blood pressure 126/80, pulse 86, height '5\' 7"'$  (1.702 m), weight 174 lb (78.9 kg).  General appearance: alert and no distress Neck: no adenopathy, no carotid bruit, no JVD, supple, symmetrical, trachea  midline, and thyroid not enlarged, symmetric, no tenderness/mass/nodules Lungs: clear to auscultation bilaterally Heart: regular rate and rhythm, S1, S2 normal, no murmur, click, rub or gallop Extremities: extremities normal, atraumatic, no cyanosis or edema Pulses: 2+ and symmetric Skin: Skin color, texture, turgor normal. No rashes or lesions Neurologic: Grossly normal  EKG sinus rhythm 86 without ST or T wave changes.  I personally reviewed this EKG.  ASSESSMENT AND PLAN:   Atypical chest pain History of atypical chest pain in the past with coronary CTA performed 12/06/2021 revealing a coronary calcium score 0 with no evidence of obstructive plaque.  Her 2D echo was normal as well.  Palpitations History of palpitations with Zio patch that revealed occasional PACs, PVCs and 7 short runs of SVT.  She has reduced her caffeine intake and has lost 17 pounds with some improvement.  I am going to start her on Toprol-XL 12.5 mg a day.  Hyperlipidemia History of hyperlipidemia with an LDL of 140 11/13/2021 after which I began her on atorvastatin 20 mg a day which resulted in a decrease in her LDL to 76 on 26/24.     Lorretta Harp MD FACP,FACC,FAHA, [facility] 04/09/2022 2:32 PM

## 2022-04-09 NOTE — Assessment & Plan Note (Signed)
History of hyperlipidemia with an LDL of 140 11/13/2021 after which I began her on atorvastatin 20 mg a day which resulted in a decrease in her LDL to 76 on 26/24.

## 2022-04-09 NOTE — Patient Instructions (Signed)
Medication Instructions:  Your physician has recommended you make the following change in your medication:   -Start taking metoprolol succinate (Toprol- XL) 12.'5mg'$  once daily.  *If you need a refill on your cardiac medications before your next appointment, please call your pharmacy*   Follow-Up: At [facility], you and your health needs are our priority.  As part of our continuing mission to provide you with exceptional heart care, we have created designated Provider Care Teams.  These Care Teams include your primary Cardiologist (physician) and Advanced Practice Providers (APPs -  Physician Assistants and Nurse Practitioners) who all work together to provide you with the care you need, when you need it.  We recommend signing up for the patient portal called "MyChart".  Sign up information is provided on this After Visit Summary.  MyChart is used to connect with patients for Virtual Visits (Telemedicine).  Patients are able to view lab/test results, encounter notes, upcoming appointments, etc.  Non-urgent messages can be sent to your provider as well.   To learn more about what you can do with MyChart, go to [email or web address].    Your next appointment:   4 month(s)  Provider:   Quay Burow, MD

## 2022-04-11 ENCOUNTER — Ambulatory Visit: Payer: 59 | Admitting: Gastroenterology

## 2022-06-10 ENCOUNTER — Ambulatory Visit: Payer: 59 | Admitting: Internal Medicine

## 2022-06-10 ENCOUNTER — Encounter: Payer: Self-pay | Admitting: Internal Medicine

## 2022-06-10 VITALS — BP 118/78 | HR 86 | Ht 66.0 in | Wt 165.0 lb

## 2022-06-10 DIAGNOSIS — Z1211 Encounter for screening for malignant neoplasm of colon: Secondary | ICD-10-CM

## 2022-06-10 MED ORDER — NA SULFATE-K SULFATE-MG SULF 17.5-3.13-1.6 GM/177ML PO SOLN: ORAL | 0 refills | Status: DC

## 2022-06-10 NOTE — Patient Instructions (Signed)
You have been scheduled for a colonoscopy. Please follow written instructions given to you at your visit today.  Please pick up your prep supplies at the pharmacy within the next 1-3 days. If you use inhalers (even only as needed), please bring them with you on the day of your procedure.   We have sent the following medications to your pharmacy for you to pick up at your convenience: Suprep  _______________________________________________________  If your blood pressure at your visit was 140/90 or greater, please contact your primary care physician to follow up on this.  _______________________________________________________  If you are age 59 or older, your body mass index should be between 23-30. Your Body mass index is 26.63 kg/m. If this is out of the aforementioned range listed, please consider follow up with your Primary Care Provider.  If you are age 50 or younger, your body mass index should be between 19-25. Your Body mass index is 26.63 kg/m. If this is out of the aformentioned range listed, please consider follow up with your Primary Care Provider.   ________________________________________________________  The [facility] providers would like to encourage you to use [facility] to communicate with providers for non-urgent requests or questions.  Due to long hold times on the telephone, sending your provider a message by [facility] may be a faster and more efficient way to get a response.  Please allow 48 business hours for a response.  Please remember that this is for non-urgent requests.  _______________________________________________________   Due to recent changes in healthcare laws, you may see the results of your imaging and laboratory studies on MyChart before your provider has had a chance to review them.  We understand that in some cases there may be results that are confusing or concerning to you. Not all laboratory results come back in the same time frame and the provider may  be waiting for multiple results in order to interpret others.  Please give [place] 48 hours in order for your provider to thoroughly review all the results before contacting the office for clarification of your results.    Thank you for entrusting me with your care and for choosing [facility], Dr. Eulah Pont

## 2022-06-10 NOTE — Progress Notes (Signed)
Chief Complaint: Colon cancer screening  HPI : 57 year old female with history of hypothyroidism presents to discuss colon cancer screening  Denies blood in stools, changes in bowel habits, diarrhea, constipation, ab pain, N&V, dysphagia, or GERD. Denies prior colonoscopy. She did deliberately lose about 20 lbs. Back in 10/2022, she was having palpitations and was evaluated by cardiology. She was started on metroprolol for some PACs, PVCs, and SVT seen on Ziopatch. She stopped drinking caffeine as well.  With these interventions, her palpitations have decreased in frequency. Denies family history of colon cancer. Grandmother had a cancer of unclear origin. Mother has had colon polyps in the past. She has occasional SOB and chest pain during palpitation episodes.   Wt Readings from Last 3 Encounters:  06/10/22 165 lb (74.8 kg)  04/09/22 174 lb (78.9 kg)  03/24/22 173 lb 6.4 oz (78.7 kg)   Past Medical History:  Diagnosis Date   Tachycardia 10/2021   Thyroid disease    Past Surgical History:  Procedure Laterality Date   ANKLE ARTHROSCOPY     TONSILLECTOMY     Family History  Problem Relation Age of Onset   High blood pressure Mother    Cancer Maternal Grandmother    Heart attack Maternal Grandfather    Breast cancer Cousin    Esophageal cancer Neg Hx    Colon cancer Neg Hx    Social History   Tobacco Use   Smoking status: Never   Smokeless tobacco: Never  Vaping Use   Vaping Use: Never used  Substance Use Topics   Alcohol use: No   Drug use: No   Current Outpatient Medications  Medication Sig Dispense Refill   atorvastatin (LIPITOR) 20 MG tablet Take 1 tablet (20 mg total) by mouth daily. 90 tablet 3   Calcium Carbonate (CALCIUM 500 PO) Take 500 mg by mouth.     cetirizine (ZYRTEC) 10 MG tablet Take 10 mg by mouth daily.     Cholecalciferol (VITAMIN D-1000 MAX ST) 25 MCG (1000 UT) tablet Take by mouth.     fish oil-omega-3 fatty acids 1000 MG capsule Take 2 g by mouth  daily.     fluticasone (FLONASE) 50 MCG/ACT nasal spray Place 2 sprays into both nostrils daily.     levothyroxine (SYNTHROID) 112 MCG tablet Take 1 tablet (112 mcg total) by mouth daily. 90 tablet 0   metoprolol succinate (TOPROL XL) 25 MG 24 hr tablet Take 0.5 tablets (12.5 mg total) by mouth daily. 45 tablet 3   Multiple Vitamin (MULTI-VITAMIN) tablet Take 1 tablet by mouth daily.     No current facility-administered medications for this visit.   Allergies  Allergen Reactions   Hydrocodone-Acetaminophen Nausea Only   Septra [Sulfamethoxazole-Trimethoprim] Hives   Oxycodone Nausea Only   Sulfamethoxazole Rash   Review of Systems: All systems reviewed and negative except where noted in HPI.   Physical Exam: BP 118/78   Pulse 86   Ht 5\' 6"  (1.676 m)   Wt 165 lb (74.8 kg)   BMI 26.63 kg/m  Constitutional: Pleasant,well-developed, female in no acute distress. HEENT: Normocephalic and atraumatic. Conjunctivae are normal. No scleral icterus. Cardiovascular: Normal rate, regular rhythm.  Pulmonary/chest: Effort normal and breath sounds normal. No wheezing, rales or rhonchi. Abdominal: Soft, nondistended, nontender. Bowel sounds active throughout. There are no masses palpable. No hepatomegaly. Extremities: No edema Neurological: Alert and oriented to person place and time. Skin: Skin is warm and dry. No rashes noted. Psychiatric: Normal mood and affect.  Behavior is normal.  Labs 02/2022: CBC with elevated Hb of 15.3 and low WBC of 3.3. CMP unremarkable. TSH nml.   TTE 12/06/21: 1. Left ventricular ejection fraction, by estimation, is 60 to 65%. The left ventricle has normal function. The left ventricle has no regional wall motion abnormalities. Left ventricular diastolic parameters were normal. The average left ventricular  global longitudinal strain is -26.7 %. The global longitudinal strain is normal.   2. Right ventricular systolic function is normal. The right ventricular size is  normal.   3. The mitral valve is normal in structure. No evidence of mitral valve regurgitation. No evidence of mitral stenosis.   4. The aortic valve is normal in structure. Aortic valve regurgitation is trivial. No aortic stenosis is present.   5. The inferior vena cava is normal in size with greater than 50% respiratory variability, suggesting right atrial pressure of 3 mmHg.   ASSESSMENT AND PLAN: Colon cancer screening Patient presents to discuss colon cancer screening. She has never had a colonoscopy in the past. I went over the colonoscopy procedure in detail with the patient, and she is agreeable to proceeding. - Colonoscopy [facility]  Eulah Pont, MD  I spent 40 minutes of time, including in depth chart review, independent review of results as outlined above, communicating results with the patient directly, face-to-face time with the patient, coordinating care, ordering studies and medications as appropriate, and documentation.

## 2022-06-20 ENCOUNTER — Other Ambulatory Visit: Payer: Self-pay | Admitting: Family Medicine

## 2022-06-20 DIAGNOSIS — E039 Hypothyroidism, unspecified: Secondary | ICD-10-CM

## 2022-07-04 ENCOUNTER — Encounter: Payer: Self-pay | Admitting: Internal Medicine

## 2022-07-05 ENCOUNTER — Other Ambulatory Visit: Payer: Self-pay | Admitting: Family Medicine

## 2022-07-05 DIAGNOSIS — E039 Hypothyroidism, unspecified: Secondary | ICD-10-CM

## 2022-07-22 ENCOUNTER — Ambulatory Visit (AMBULATORY_SURGERY_CENTER): Payer: 59 | Admitting: Internal Medicine

## 2022-07-22 ENCOUNTER — Encounter: Payer: Self-pay | Admitting: Internal Medicine

## 2022-07-22 VITALS — BP 110/74 | HR 76 | Temp 98.0°F | Resp 13 | Ht 66.0 in | Wt 165.0 lb

## 2022-07-22 DIAGNOSIS — Z1211 Encounter for screening for malignant neoplasm of colon: Secondary | ICD-10-CM | POA: Diagnosis present

## 2022-07-22 DIAGNOSIS — D123 Benign neoplasm of transverse colon: Secondary | ICD-10-CM

## 2022-07-22 DIAGNOSIS — K621 Rectal polyp: Secondary | ICD-10-CM | POA: Diagnosis not present

## 2022-07-22 DIAGNOSIS — D122 Benign neoplasm of ascending colon: Secondary | ICD-10-CM | POA: Diagnosis not present

## 2022-07-22 DIAGNOSIS — D128 Benign neoplasm of rectum: Secondary | ICD-10-CM | POA: Diagnosis not present

## 2022-07-22 DIAGNOSIS — K635 Polyp of colon: Secondary | ICD-10-CM | POA: Diagnosis not present

## 2022-07-22 MED ORDER — SODIUM CHLORIDE 0.9 % IV SOLN: 500.0000 mL | Freq: Once | INTRAVENOUS | Status: DC

## 2022-07-22 NOTE — Progress Notes (Signed)
GASTROENTEROLOGY PROCEDURE H&P NOTE   Primary Care Physician: Mliss Sax, MD    Reason for Procedure:   Colon cancer screening  Plan:    Colonoscopy  Patient is appropriate for endoscopic procedure(s) in the ambulatory ([facility]) setting.  The nature of the procedure, as well as the risks, benefits, and alternatives were carefully and thoroughly reviewed with the patient. Ample time for discussion and questions allowed. The patient understood, was satisfied, and agreed to proceed.     HPI: Melinda Morales is a 57 y.o. female who presents for colonoscopy for evaluation of colon cancer screening .  Patient was most recently seen in the Gastroenterology Clinic on 06/10/22.  No interval change in medical history since that appointment. Please refer to that note for full details regarding GI history and clinical presentation.   Past Medical History:  Diagnosis Date   Tachycardia 10/2021   Thyroid disease     Past Surgical History:  Procedure Laterality Date   ANKLE ARTHROSCOPY     TONSILLECTOMY      Prior to Admission medications   Medication Sig Start Date End Date Taking? Authorizing Provider  atorvastatin (LIPITOR) 20 MG tablet Take 1 tablet (20 mg total) by mouth daily. 12/06/21   Runell Gess, MD  Calcium Carbonate (CALCIUM 500 PO) Take 500 mg by mouth.    [provider]  cetirizine (ZYRTEC) 10 MG tablet Take 10 mg by mouth daily.    [provider]  Cholecalciferol (VITAMIN D-1000 MAX ST) 25 MCG (1000 UT) tablet Take by mouth.    [provider]  fish oil-omega-3 fatty acids 1000 MG capsule Take 2 g by mouth daily.    [provider]  fluticasone (FLONASE) 50 MCG/ACT nasal spray Place 2 sprays into both nostrils daily. 09/11/21   [provider]  levothyroxine (SYNTHROID) 112 MCG tablet TAKE 1 TABLET BY MOUTH EVERY DAY 07/07/22   Mliss Sax, MD  metoprolol succinate (TOPROL XL) 25 MG 24 hr tablet Take  0.5 tablets (12.5 mg total) by mouth daily. 04/09/22   Runell Gess, MD  Multiple Vitamin (MULTI-VITAMIN) tablet Take 1 tablet by mouth daily.    [provider]  Na Sulfate-K Sulfate-Mg Sulf 17.5-3.13-1.6 GM/177ML SOLN Use as directed; may use generic; goodrx card if insurance will not cover generic 06/10/22   Imogene Burn, MD    Current Outpatient Medications  Medication Sig Dispense Refill   atorvastatin (LIPITOR) 20 MG tablet Take 1 tablet (20 mg total) by mouth daily. 90 tablet 3   Calcium Carbonate (CALCIUM 500 PO) Take 500 mg by mouth.     cetirizine (ZYRTEC) 10 MG tablet Take 10 mg by mouth daily.     Cholecalciferol (VITAMIN D-1000 MAX ST) 25 MCG (1000 UT) tablet Take by mouth.     fish oil-omega-3 fatty acids 1000 MG capsule Take 2 g by mouth daily.     fluticasone (FLONASE) 50 MCG/ACT nasal spray Place 2 sprays into both nostrils daily.     levothyroxine (SYNTHROID) 112 MCG tablet TAKE 1 TABLET BY MOUTH EVERY DAY 90 tablet 0   metoprolol succinate (TOPROL XL) 25 MG 24 hr tablet Take 0.5 tablets (12.5 mg total) by mouth daily. 45 tablet 3   Multiple Vitamin (MULTI-VITAMIN) tablet Take 1 tablet by mouth daily.     Na Sulfate-K Sulfate-Mg Sulf 17.5-3.13-1.6 GM/177ML SOLN Use as directed; may use generic; goodrx card if insurance will not cover generic 354 mL 0   Current  Facility-Administered Medications  Medication Dose Route Frequency Provider Last Rate Last Admin   0.9 %  sodium chloride infusion  500 mL Intravenous Once Imogene Burn, MD        Allergies as of 07/22/2022 - Review Complete 06/10/2022  Allergen Reaction Noted   Hydrocodone-acetaminophen Nausea Only 06/15/2013   Septra [sulfamethoxazole-trimethoprim] Hives 03/19/2012   Oxycodone Nausea Only 03/11/2013   Sulfamethoxazole Rash 03/11/2013    Family History  Problem Relation Age of Onset   High blood pressure Mother    Cancer Maternal Grandmother    Heart attack Maternal Grandfather    Breast  cancer Cousin    Esophageal cancer Neg Hx    Colon cancer Neg Hx     Social History   Socioeconomic History   Marital status: Married    Spouse name: Not on file   Number of children: 0   Years of education: Not on file   Highest education level: Not on file  Occupational History   Occupation: [employer]  Tobacco Use   Smoking status: Never   Smokeless tobacco: Never  Vaping Use   Vaping Use: Never used  Substance and Sexual Activity   Alcohol use: No   Drug use: No   Sexual activity: Never    Birth control/protection: None  Other Topics Concern   Not on file  Social History Narrative   Not on file   Social Determinants of Health   Financial Resource Strain: Not on file  Food Insecurity: Not on file  Transportation Needs: Not on file  Physical Activity: Not on file  Stress: Not on file  Social Connections: Not on file  Intimate Partner Violence: Not on file    Physical Exam: Vital signs in last 24 hours: BP 110/66   Pulse 77   Temp 98 F (36.7 C) (Temporal)   Ht 5\' 6"  (1.676 m)   Wt 165 lb (74.8 kg)   SpO2 97%   BMI 26.63 kg/m  GEN: NAD EYE: Sclerae anicteric ENT: MMM CV: Non-tachycardic Pulm: No increased WOB GI: Soft NEURO:  Alert & Oriented   Eulah Pont, MD [facility]   07/22/2022 9:03 AM

## 2022-07-22 NOTE — Op Note (Signed)
[facility] Patient Name: Melinda Morales Procedure Date: 07/22/2022 9:27 AM MRN: [id number] Endoscopist: Madelyn Brunner Pleak , , [id number] Age: 57 Referring MD:  Date of Birth: 17-Oct-1965 Gender: Female Account #: [id number] Procedure:                Colonoscopy Indications:              Screening for colorectal malignant neoplasm Medicines:                Monitored Anesthesia Care Procedure:                Pre-Anesthesia Assessment:                           - Prior to the procedure, a History and Physical                            was performed, and patient medications and                            allergies were reviewed. The patient's tolerance of                            previous anesthesia was also reviewed. The risks                            and benefits of the procedure and the sedation                            options and risks were discussed with the patient.                            All questions were answered, and informed consent                            was obtained. Prior Anticoagulants: The patient has                            taken no anticoagulant or antiplatelet agents. ASA                            Grade Assessment: II - A patient with mild systemic                            disease. After reviewing the risks and benefits,                            the patient was deemed in satisfactory condition to                            undergo the procedure.                           After obtaining informed consent, the colonoscope  was passed under direct vision. Throughout the                            procedure, the patient's blood pressure, pulse, and                            oxygen saturations were monitored continuously. The                            Olympus PCF-[id number] (#[id number]) Colonoscope was                            introduced through the anus and advanced to the the                            cecum,  identified by appendiceal orifice and                            ileocecal valve. The colonoscopy was performed                            without difficulty. The patient tolerated the                            procedure well. The quality of the bowel                            preparation was good. The ileocecal valve,                            appendiceal orifice, and rectum were photographed. Scope In: 9:34:42 AM Scope Out: 9:51:04 AM Scope Withdrawal Time: 0 hours 11 minutes 51 seconds  Total Procedure Duration: 0 hours 16 minutes 22 seconds  Findings:                 Multiple diverticula were found in the entire colon.                           Three sessile polyps were found in the transverse                            colon and ascending colon. The polyps were 4 to 10                            mm in size. These polyps were removed with a cold                            snare. Resection and retrieval were complete.                           A 3 mm polyp was found in the rectum. The polyp was  sessile. The polyp was removed with a cold snare.                            Resection and retrieval were complete.                           Non-bleeding internal hemorrhoids were found during                            retroflexion. Complications:            No immediate complications. Estimated Blood Loss:     Estimated blood loss was minimal. Impression:               - Diverticulosis in the entire examined colon.                           - Three 4 to 10 mm polyps in the transverse colon                            and in the ascending colon, removed with a cold                            snare. Resected and retrieved.                           - One 3 mm polyp in the rectum, removed with a cold                            snare. Resected and retrieved.                           - Non-bleeding internal hemorrhoids. Recommendation:           - Discharge patient  to home (with escort).                           - Await pathology results.                           - The findings and recommendations were discussed                            with the patient. Dr Particia Lather "Alan Ripper" Leonides Schanz,  07/22/2022 9:58:11 AM

## 2022-07-22 NOTE — Progress Notes (Signed)
Called to room to assist during endoscopic procedure.  Patient ID and intended procedure confirmed with present staff. Received instructions for my participation in the procedure from the performing physician.  

## 2022-07-22 NOTE — Progress Notes (Signed)
Uneventful anesthetic. Report to pacu rn. Vss. Care resumed by rn. 

## 2022-07-22 NOTE — Patient Instructions (Signed)
Discharge instructions given. Handouts on polyps,diverticulosis and hemorrhoids. Resume previous medications. YOU HAD AN ENDOSCOPIC PROCEDURE TODAY AT THE [facility]:   Refer to the procedure report that was given to you for any specific questions about what was found during the examination.  If the procedure report does not answer your questions, please call your gastroenterologist to clarify.  If you requested that your care partner not be given the details of your procedure findings, then the procedure report has been included in a sealed envelope for you to review at your convenience later.  YOU SHOULD EXPECT: Some feelings of bloating in the abdomen. Passage of more gas than usual.  Walking can help get rid of the air that was put into your GI tract during the procedure and reduce the bloating. If you had a lower endoscopy (such as a colonoscopy or flexible sigmoidoscopy) you may notice spotting of blood in your stool or on the toilet paper. If you underwent a bowel prep for your procedure, you may not have a normal bowel movement for a few days.  Please Note:  You might notice some irritation and congestion in your nose or some drainage.  This is from the oxygen used during your procedure.  There is no need for concern and it should clear up in a day or so.  SYMPTOMS TO REPORT IMMEDIATELY:  Following lower endoscopy (colonoscopy or flexible sigmoidoscopy):  Excessive amounts of blood in the stool  Significant tenderness or worsening of abdominal pains  Swelling of the abdomen that is new, acute  Fever of 100F or higher   For urgent or emergent issues, a gastroenterologist can be reached at any hour by calling [phone]. Do not use MyChart messaging for urgent concerns.    DIET:  We do recommend a small meal at first, but then you may proceed to your regular diet.  Drink plenty of fluids but you should avoid alcoholic beverages for 24 hours.  ACTIVITY:  You should  plan to take it easy for the rest of today and you should NOT DRIVE or use heavy machinery until tomorrow (because of the sedation medicines used during the test).    FOLLOW UP: Our staff will call the number listed on your records the next business day following your procedure.  We will call around 7:15- 8:00 am to check on you and address any questions or concerns that you may have regarding the information given to you following your procedure. If we do not reach you, we will leave a message.     If any biopsies were taken you will be contacted by phone or by letter within the next 1-3 weeks.  Please call us at [phone] if you have not heard about the biopsies in 3 weeks.    SIGNATURES/CONFIDENTIALITY: You and/or your care partner have signed paperwork which will be entered into your electronic medical record.  These signatures attest to the fact that that the information above on your After Visit Summary has been reviewed and is understood.  Full responsibility of the confidentiality of this discharge information lies with you and/or your care-partner. 

## 2022-07-23 ENCOUNTER — Telehealth: Payer: Self-pay | Admitting: *Deleted

## 2022-07-23 NOTE — Telephone Encounter (Signed)
  Follow up Call-     07/22/2022    8:59 AM  Call back number  Post procedure Call Back phone  # [phone]  Permission to leave phone message Yes     Patient questions:  Do you have a fever, pain , or abdominal swelling? No. Pain Score  0 *  Have you tolerated food without any problems? Yes.    Have you been able to return to your normal activities? Yes.    Do you have any questions about your discharge instructions: Diet   No. Medications  No. Follow up visit  No.  Do you have questions or concerns about your Care? No.  Actions: * If pain score is 4 or above: No action needed, pain <4.

## 2022-07-25 ENCOUNTER — Encounter: Payer: Self-pay | Admitting: Internal Medicine

## 2022-08-15 ENCOUNTER — Other Ambulatory Visit: Payer: Self-pay | Admitting: Obstetrics and Gynecology

## 2022-08-15 ENCOUNTER — Ambulatory Visit: Payer: 59 | Admitting: Obstetrics and Gynecology

## 2022-08-15 ENCOUNTER — Encounter: Payer: Self-pay | Admitting: Obstetrics and Gynecology

## 2022-08-15 VITALS — BP 127/85 | HR 68 | Ht 66.0 in | Wt 159.0 lb

## 2022-08-15 DIAGNOSIS — N952 Postmenopausal atrophic vaginitis: Secondary | ICD-10-CM

## 2022-08-15 DIAGNOSIS — N941 Unspecified dyspareunia: Secondary | ICD-10-CM

## 2022-08-15 DIAGNOSIS — Z01419 Encounter for gynecological examination (general) (routine) without abnormal findings: Secondary | ICD-10-CM

## 2022-08-15 MED ORDER — ESTRADIOL 10 MCG VA TABS
1.0000 | ORAL_TABLET | Freq: Every day | VAGINAL | 12 refills | Status: DC
Start: 2022-08-15 — End: 2022-08-15

## 2022-08-15 NOTE — Progress Notes (Signed)
ANNUAL EXAM Patient name: Melinda Morales MRN [id number]  Date of birth: 1965/01/29 Chief Complaint:   Gynecologic Exam  History of Present Illness:   Melinda Morales is a 57 y.o. G0P0000 being seen today for a routine annual exam.  Current complaints: none  Menstrual concerns? No   Breast or nipple changes? No hx of dense breast Contraception use? No  Sexually active? Yes - pain with intercourse, dryness and deep penetration   Wanting to [facility] all of [facility] care at [facility] after establishing cardio [facility] following some cardiac issues.   Hx of abnormal pap years ago that required in office procedure but no surgeries or other issues and no abnormal paps recently.   No LMP recorded. Patient is postmenopausal.   The pregnancy intention screening data noted above was reviewed. Potential methods of contraception were discussed. The patient elected to proceed with No data recorded.   Last pap No results found for: "DIAGPAP", "HPVHIGH", "ADEQPAP"   H/O abnormal pap: yes Last mammogram: 2023 BIRADS 1 Last colonoscopy: 2024.      03/24/2022    9:22 AM 03/24/2022    8:43 AM  Depression screen PHQ 2/9  Decreased Interest 0 0  Down, Depressed, Hopeless 0 0  PHQ - 2 Score 0 0  Altered sleeping 0   Tired, decreased energy 0   Change in appetite 0   Feeling bad or failure about yourself  0   Trouble concentrating 0   Moving slowly or fidgety/restless 0   Suicidal thoughts 0   PHQ-9 Score 0   Difficult doing work/chores Not difficult at all         03/24/2022    9:23 AM  GAD 7 : Generalized Anxiety Score  Nervous, Anxious, on Edge 0  Control/stop worrying 0  Worry too much - different things 0  Trouble relaxing 0  Restless 0  Easily annoyed or irritable 0  Afraid - awful might happen 0  Total GAD 7 Score 0  Anxiety Difficulty Not difficult at all     Review of Systems:   Pertinent items are noted in HPI Denies any headaches, blurred vision, fatigue, shortness of  breath, chest pain, abdominal pain, abnormal vaginal discharge/itching/odor/irritation, problems with periods, bowel movements, urination, or intercourse unless otherwise stated above. Pertinent History Reviewed:  Reviewed past medical,surgical, social and family history.  Reviewed problem list, medications and allergies. Physical Assessment:   Vitals:   08/15/22 0819  BP: 127/85  Pulse: 68  Weight: 159 lb (72.1 kg)  Body mass index is 25.66 kg/m.        Physical Examination:   General appearance - well appearing, and in no distress  Mental status - alert, oriented to person, place, and time  Psych:  She has a normal mood and affect  Skin - warm and dry, normal color, no suspicious lesions noted  Chest - effort normal, all lung fields clear to auscultation bilaterally  Heart - normal rate and regular rhythm  Breasts - breasts appear normal, no suspicious masses, no skin or nipple changes or  axillary nodes  Abdomen - soft, nontender, nondistended, no masses or organomegaly  Pelvic -  VULVA: normal appearing vulva with no masses, tenderness or lesions   VAGINA: atrophic vaginal introitus without allodynia, tenderness throughout pelvic floor    ADNEXA: No adnexal masses or tenderness noted.  Extremities:  No swelling or varicosities noted  Chaperone present for exam  No results found for this or any previous visit (  from the past 24 hour(s)).    Assessment & Plan:   1. Well woman exam with routine gynecological exam - Cervical cancer screening: Discussed guidelines. Pap with HPV up to date - Breast Health: Encouraged self breast awareness/SBE. Discussed limits of clinical breast exam for detecting breast cancer. Discussed importance of annual MXR. Rx given for MXR - Climacteric/Sexual health: Reviewed typical and atypical symptoms of menopause/peri-menopause. Discussed PMB and to call if any amount of spotting.  - Colonoscopy: up to date - F/U 12 months and prn  - MM 3D  SCREENING MAMMOGRAM BILATERAL BREAST; Future  2. Atrophic vaginitis Discussed menopausal changes and use of lubricant (synthetic and natural) as well as vaginal estrogen. Would prefer to try vaginal tablet - Estradiol 10 MCG TABS vaginal tablet; Place 1 tablet (10 mcg total) vaginally at bedtime. Nightly for 2 weeks then 2 nights a week  Dispense: 30 tablet; Refill: 12  3. 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 Referral to PFPT with follow up to response in 2-3 months  - Ambulatory referral to Physical Therapy    Orders Placed This Encounter  Procedures   MM 3D SCREENING MAMMOGRAM BILATERAL BREAST   Ambulatory referral to Physical Therapy    Meds:  Meds ordered this encounter  Medications   Estradiol 10 MCG TABS vaginal tablet    Sig: Place 1 tablet (10 mcg total) vaginally at bedtime. Nightly for 2 weeks then 2 nights a week    Dispense:  30 tablet    Refill:  12    Follow-up: Return in about 3 months (around 11/15/2022).  Lorriane Shire, MD 08/15/2022 8:27 AM

## 2022-08-15 NOTE — Addendum Note (Signed)
Addended by: Harlon Ditty on: 08/15/2022 01:57 PM   Modules accepted: Orders

## 2022-08-19 ENCOUNTER — Ambulatory Visit (HOSPITAL_BASED_OUTPATIENT_CLINIC_OR_DEPARTMENT_OTHER)
Admission: RE | Admit: 2022-08-19 | Discharge: 2022-08-19 | Disposition: A | Discharge status: Home / Self Care | Payer: 59 | Source: Ambulatory Visit | Attending: Obstetrics and Gynecology | Admitting: Obstetrics and Gynecology

## 2022-08-19 ENCOUNTER — Encounter (HOSPITAL_BASED_OUTPATIENT_CLINIC_OR_DEPARTMENT_OTHER): Payer: Self-pay

## 2022-08-19 DIAGNOSIS — Z01419 Encounter for gynecological examination (general) (routine) without abnormal findings: Secondary | ICD-10-CM | POA: Diagnosis present

## 2022-08-19 DIAGNOSIS — Z1231 Encounter for screening mammogram for malignant neoplasm of breast: Secondary | ICD-10-CM | POA: Insufficient documentation

## 2022-08-26 ENCOUNTER — Ambulatory Visit: Payer: 59 | Admitting: Cardiovascular Disease

## 2022-09-27 ENCOUNTER — Other Ambulatory Visit: Payer: Self-pay | Admitting: Cardiovascular Disease

## 2022-10-22 ENCOUNTER — Ambulatory Visit: Payer: 59

## 2022-11-21 ENCOUNTER — Ambulatory Visit: Payer: 59 | Admitting: Obstetrics and Gynecology
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
## 2022-12-02 ENCOUNTER — Encounter: Payer: Self-pay | Admitting: Physical Therapy

## 2022-12-02 ENCOUNTER — Ambulatory Visit: Payer: 59 | Attending: Obstetrics and Gynecology | Admitting: Physical Therapy

## 2022-12-02 ENCOUNTER — Other Ambulatory Visit: Payer: Self-pay

## 2022-12-02 DIAGNOSIS — N941 Unspecified dyspareunia: Secondary | ICD-10-CM | POA: Insufficient documentation

## 2022-12-02 DIAGNOSIS — R278 Other lack of coordination: Secondary | ICD-10-CM | POA: Insufficient documentation

## 2022-12-02 DIAGNOSIS — M6281 Muscle weakness (generalized): Secondary | ICD-10-CM | POA: Diagnosis not present

## 2022-12-03 ENCOUNTER — Ambulatory Visit: Payer: 59 | Attending: Cardiovascular Disease | Admitting: Cardiovascular Disease

## 2022-12-03 ENCOUNTER — Encounter: Payer: Self-pay | Admitting: Cardiovascular Disease

## 2022-12-03 VITALS — BP 129/87 | HR 65 | Ht 67.0 in | Wt 152.0 lb

## 2022-12-03 DIAGNOSIS — E782 Mixed hyperlipidemia: Secondary | ICD-10-CM

## 2022-12-03 DIAGNOSIS — R0789 Other chest pain: Secondary | ICD-10-CM | POA: Diagnosis not present

## 2022-12-03 DIAGNOSIS — R002 Palpitations: Secondary | ICD-10-CM

## 2022-12-03 NOTE — Progress Notes (Signed)
12/03/2022 GENEVIA BOULDIN   [birth year]  [id number]  Primary Physician Mliss Sax, MD Primary Cardiologist: Runell Gess MD FACP, [place], [place], [place]  HPI:  Melinda Morales is a 57 y.o.   mildly overweight married Caucasian female with no children who was referred by the ER for chest pain after recent evaluation in 11/06/2021.  She is a Product manager.  I last saw her in the office 04/09/2022.  She has no risk factors for ischemic heart disease.  Her mother did have a myocardial infarction however this was at age 65.  She is never had heart attack or stroke.  She did break her ankle in August and wore a boot for a while.  Over the last several weeks she has been awakened from sleep with palpitations as described atypical left-sided chest pain with dyspnea on exertion.  She was seen in the ER 11/06/2021 with the symptoms and had a negative work-up at that time.   I obtained a coronary CTA on her 12/06/2021 which revealed a coronary calcium score of 0 without obstructive disease.  A Zio patch revealed occasional PACs, PVCs and short runs of SVT.  2D echo was normal.  Since I saw her she has lost 17 pounds on weight watchers and reduced her caffeine intake significantly.  Her chest pain has significantly improved as have her palpitations.  Since I saw her 9 months ago she is lost over 25 pounds on weight watchers.  She feels clinically improved.  Her prior symptoms have improved as well.   Current Meds  Medication Sig   atorvastatin (LIPITOR) 20 MG tablet TAKE 1 TABLET BY MOUTH EVERY DAY   Calcium Carbonate (CALCIUM 500 PO) Take 500 mg by mouth.   cetirizine (ZYRTEC) 10 MG tablet Take 10 mg by mouth daily.   Cholecalciferol (VITAMIN D-1000 MAX ST) 25 MCG (1000 UT) tablet Take by mouth.   fish oil-omega-3 fatty acids 1000 MG capsule Take 2 g by mouth daily.   fluticasone (FLONASE) 50 MCG/ACT nasal spray Place 2 sprays into both nostrils daily.   levothyroxine  (SYNTHROID) 112 MCG tablet TAKE 1 TABLET BY MOUTH EVERY DAY   metoprolol succinate (TOPROL XL) 25 MG 24 hr tablet Take 0.5 tablets (12.5 mg total) by mouth daily.   Multiple Vitamin (MULTI-VITAMIN) tablet Take 1 tablet by mouth daily.   YUVAFEM 10 MCG TABS vaginal tablet PLACE 1 TABLET (10 MCG TOTAL) VAGINALLY AT BEDTIME. NIGHTLY FOR 2 WEEKS THEN 2 NIGHTS A WEEK     Allergies  Allergen Reactions   Hydrocodone-Acetaminophen Nausea Only   Septra [Sulfamethoxazole-Trimethoprim] Hives   Oxycodone Nausea Only   Sulfamethoxazole Rash    Social History   Socioeconomic History   Marital status: Married    Spouse name: Not on file   Number of children: 0   Years of education: Not on file   Highest education level: Not on file  Occupational History   Occupation: atternoy  Tobacco Use   Smoking status: Never   Smokeless tobacco: Never  Vaping Use   Vaping status: Never Used  Substance and Sexual Activity   Alcohol use: No   Drug use: No   Sexual activity: Yes    Birth control/protection: None  Other Topics Concern   Not on file  Social History Narrative   Not on file   Social Determinants of Health   Financial Resource Strain: Not on file  Food Insecurity: Not on file  Transportation  Needs: Not on file  Physical Activity: Not on file  Stress: Not on file  Social Connections: Not on file  Intimate Partner Violence: Not on file     Review of Systems: General: negative for chills, fever, night sweats or weight changes.  Cardiovascular: negative for chest pain, dyspnea on exertion, edema, orthopnea, palpitations, paroxysmal nocturnal dyspnea or shortness of breath Dermatological: negative for rash Respiratory: negative for cough or wheezing Urologic: negative for hematuria Abdominal: negative for nausea, vomiting, diarrhea, bright red blood per rectum, melena, or hematemesis Neurologic: negative for visual changes, syncope, or dizziness All other systems reviewed and are  otherwise negative except as noted above.    Blood pressure 129/87, pulse 65, height 5\' 7"  (1.702 m), weight 152 lb (68.9 kg), SpO2 97%.  General appearance: alert and no distress Neck: no adenopathy, no carotid bruit, no JVD, supple, symmetrical, trachea midline, and thyroid not enlarged, symmetric, no tenderness/mass/nodules Lungs: clear to auscultation bilaterally Heart: regular rate and rhythm, S1, S2 normal, no murmur, click, rub or gallop Extremities: extremities normal, atraumatic, no cyanosis or edema Pulses: 2+ and symmetric Skin: Skin color, texture, turgor normal. No rashes or lesions Neurologic: Grossly normal  EKG EKG Interpretation Date/Time:  Wednesday December 03 2022 08:43:50 EST Ventricular Rate:  65 PR Interval:  170 QRS Duration:  76 QT Interval:  422 QTC Calculation: 438 R Axis:   48  Text Interpretation: Normal sinus rhythm Low voltage QRS When compared with ECG of 06-Nov-2021 13:26, No significant change was found Confirmed by Nanetta Batty [phone]) on 12/03/2022 9:12:04 AM    ASSESSMENT AND PLAN:   Atypical chest pain History of atypical chest pain with a coronary CTA showed a coronary calcium score 0 with minimal nonobstructive CAD.  These episodes are fairly infrequent.  Palpitations History of palpitations with Zio patch revealing occasional PACs, PVCs and short runs of SVT.  These have significantly improved over time as well.  Hyperlipidemia History of hyperlipidemia on statin therapy with lipid profile performed 03/24/2022 revealing a total cholesterol 146, LDL 75 and HDL 56.     Runell Gess MD [facility], [facility] 12/03/2022 9:24 AM

## 2022-12-03 NOTE — Assessment & Plan Note (Signed)
History of hyperlipidemia on statin therapy with lipid profile performed 03/24/2022 revealing a total cholesterol 146, LDL 75 and HDL 56.

## 2022-12-03 NOTE — Assessment & Plan Note (Signed)
History of palpitations with Zio patch revealing occasional PACs, PVCs and short runs of SVT.  These have significantly improved over time as well.

## 2022-12-03 NOTE — Assessment & Plan Note (Signed)
History of atypical chest pain with a coronary CTA showed a coronary calcium score 0 with minimal nonobstructive CAD.  These episodes are fairly infrequent.

## 2022-12-03 NOTE — Patient Instructions (Addendum)
Medication Instructions:   No changes *If you need a refill on your cardiac medications before your next appointment, please call your pharmacy*   Lab Work: Not  needed    Testing/Procedures:  Not needed  Follow-Up: At [facility], you and your health needs are our priority.  As part of our continuing mission to provide you with exceptional heart care, we have created designated Provider Care Teams.  These Care Teams include your primary Cardiologist (physician) and Advanced Practice Providers (APPs -  Physician Assistants and Nurse Practitioners) who all work together to provide you with the care you need, when you need it.     Your next appointment:   12 month(s)  The format for your next appointment:   In Person  Provider:   Nanetta Batty, MD

## 2022-12-09 ENCOUNTER — Ambulatory Visit: Payer: 59 | Admitting: Physical Therapy

## 2022-12-09 DIAGNOSIS — M6281 Muscle weakness (generalized): Secondary | ICD-10-CM

## 2022-12-09 DIAGNOSIS — R278 Other lack of coordination: Secondary | ICD-10-CM

## 2022-12-09 DIAGNOSIS — N941 Unspecified dyspareunia: Secondary | ICD-10-CM | POA: Diagnosis not present

## 2022-12-09 DIAGNOSIS — M62838 Other muscle spasm: Secondary | ICD-10-CM

## 2022-12-09 NOTE — Therapy (Signed)
OUTPATIENT PHYSICAL THERAPY FEMALE PELVIC EVALUATION   Patient Name: FRENCHIE KONISHI MRN: [id number] DOB:1965/12/10, 57 y.o., female Today's Date: 12/09/2022  END OF SESSION:  PT End of Session - 12/09/22 1406     Visit Number 2    PT Start Time 1230    PT Stop Time 1320    PT Time Calculation (min) 50 min    Activity Tolerance Patient tolerated treatment well    Behavior During Therapy [facility] for tasks assessed/performed              Past Medical History:  Diagnosis Date   Hyperlipidemia    Hypertension    Tachycardia 10/2021   Thyroid disease    Past Surgical History:  Procedure Laterality Date   ANKLE ARTHROSCOPY     TONSILLECTOMY     Patient Active Problem List   Diagnosis Date Noted   Hyperlipidemia 04/09/2022   Obstructive sleep apnea 01/01/2022   Atypical chest pain 11/12/2021   Palpitations 11/12/2021   Dyspnea on exertion 11/12/2021    PCP: Mliss Sax, MD  REFERRING PROVIDER: Lorriane Shire, MD  REFERRING DIAG: N94.10 (ICD-10-CM) - Dyspareunia in female   THERAPY DIAG:  Muscle weakness (generalized)  Other lack of coordination  Other muscle spasm  Rationale for Evaluation and Treatment: Rehabilitation  ONSET DATE:spring 2023 SUBJECTIVE:                                                                                                                                                                                           SUBJECTIVE STATEMENT: Pt reports that the exercises were hard on her knees, she has had some knee pain as she gets older and difficulty with squatting and getting up from the squat. Pt is doing an estrogen pill vaginally.  No vaginal moisturizers yet.    Pt reports that she has had dyspareunia and urinary incontinence - when she has to go, she has to go. Never has had PT before. Has had  urinary urgency and  frequency as well.  She has also had a lot of dizziness and ringing in her ears. Has seen and  ENT  Fluid intake: to be asked   PAIN:  Are you having pain? Yes sometimes her knees, her shoulder, her right elbow, has a bone spur, ringing of her ears NPRS scale: 8/10 Pain location: Internal, Deep, Bilateral, and Vaginal  Pain type: burning Pain description: intermittent and burning   Aggravating factors: intercourse Relieving factors: no intercourse  PRECAUTIONS: None  RED FLAGS: None   WEIGHT BEARING RESTRICTIONS: No  FALLS:  Has patient fallen in last 6 months? No  LIVING  ENVIRONMENT: Lives with: lives with their spouse Lives in: House/apartment Stairs: No Has following equipment at home: None  OCCUPATION: lawyer  PLOF: Independent  PATIENT GOALS: to not have pain with IC  PERTINENT HISTORY:  Pt fractured her ankle about a year and half ago, symptoms got worse then Sexual abuse: no  BOWEL MOVEMENT: No issues  URINATION: Pain with urination: No Fully empty bladder: Yes: maybe not Stream: Strong Urgency: Yes:   Frequency: yes Leakage: Urge to void, Walking to the bathroom, Coughing, Sneezing, Laughing, and Bending forward Pads: No  INTERCOURSE: Pain with intercourse: Initial Penetration, During Penetration, After Intercourse, During Climax, and Pain Interrupts Intercourse Ability to have vaginal penetration:  Yes:   Climax: yes Marinoff Scale: 2/3  PREGNANCY: Vaginal deliveries 0 Tearing No C-section deliveries 0 Currently pregnant No  PROLAPSE: None   OBJECTIVE:  Note: Objective measures were completed at Evaluation unless otherwise noted.     COGNITION: Overall cognitive status: Within functional limits for tasks assessed     SENSATION: Light touch: Appears intact Proprioception: Appears intact  MUSCLE LENGTH: Hamstrings: Right 70 deg; Left 80 deg   POSTURE: rounded shoulders, forward head, decreased lumbar lordosis, and posterior pelvic tilt  PELVIC ALIGNMENT: even  LUMBARAROM/PROM:  A/PROM A/PROM  eval  Flexion  Within functional limitations   Extension Within functional limitations   Right lateral flexion   Left lateral flexion   Right rotation   Left rotation    (Blank rows = not tested)  LOWER EXTREMITY ROM: some tightness throughout   LOWER EXTREMITY MMT: grossly 4/5 overall  PALPATION:   General within functional limitations                 External Perineal Exam seems within functional limitations                              Internal Pelvic Floor tight and tender superficial layers and perineum  Patient confirms identification and approves PT to assess internal pelvic floor and treatment Yes  PELVIC MMT:   MMT eval  Vaginal 4/5  Internal Anal Sphincter   External Anal Sphincter   Puborectalis   Diastasis Recti   (Blank rows = not tested)        TONE: High   PROLAPSE:no  Breathing- upper chest  TODAY'S TREATMENT:                                                                                                                              DATE: 12/09/22   EVAL see above Manual- perineal massage demonstrated and pt educated on,       Therapeutic exercises- happy baby, child's pose, DB, reverse kegel    Therapeutic activities- dilator use, lubricants, exercises, wand  PATIENT EDUCATION:  Education details: exam findings, relevant anatomy, exercises Person educated: Patient Education method: Explanation, Demonstration, Tactile cues, Verbal cues, and Handouts Education comprehension: verbalized understanding  and needs further education  HOME EXERCISE PROGRAM: [id number]  ASSESSMENT:  CLINICAL IMPRESSION: Pt with tight and tender all layers of pelvic floor, good contraction, difficulty with reverse Kegel and pelvic floor lengthening. Educated her today on using wand or dilators to help with down training to reduce pain with intercourse. Discussed trial of dry needling next appt. Pt reported that she is not sure if she would do dilators or wand, does not own a  vibrator. Pt seems uncomfortable and embarrassed discussing pelvic issues, she will continue to benefit from PT.   Patient is a 57 y.o. F who was seen today for physical therapy evaluation and treatment for dyspareunia. She also presents with urge incontinence, urinary urgency and frequency and incomplete bladder emptying. She reported that she is inactive and has lost 35 lbs with weight watchers this year. Pt did well with internal pelvic floor assessment - she has some tightness in superficial pelvic floor muscles. Educated on perineal massage, lubricants and vaginal estrogen.   Patient with reports of tinnitus and dizziness that she has had since last August.  She was treated for BPPV once , it helped a little bit, has done some home treatment, but has not had much success.  Patient may benefit from a Vestibular Assessment and canalith repositioning during her PT admission.  OBJECTIVE IMPAIRMENTS: decreased coordination, decreased knowledge of condition, decreased ROM, decreased strength, increased fascial restrictions, increased muscle spasms, and pain.   ACTIVITY LIMITATIONS: continence and toileting  PARTICIPATION LIMITATIONS: interpersonal relationship  PERSONAL FACTORS: Time since onset of injury/illness/exacerbation are also affecting patient's functional outcome.   REHAB POTENTIAL: Good  CLINICAL DECISION MAKING: Stable/uncomplicated  EVALUATION COMPLEXITY: Low   GOALS: Goals reviewed with patient? Yes  SHORT TERM GOALS: Target date: 12/30/2022   Pt will be I and consistent with her initial HEP  Baseline: Goal status: INITIAL  2.  Patient will undergo a vestibular evaluation, if dizziness persists. Baseline:  Goal status: INITIAL  3.  Pt will be I with urge drill Baseline:  Goal status: INITIAL  4.  Pt will report reduced pain with intercourse to max 3/10 Baseline:  Goal status: INITIAL   LONG TERM GOALS: Target date: 02/24/2023    Pt will soak 0 pads/  day Baseline:  Goal status: INITIAL  2.  Patient will report at least an 80% improvement in dizziness symptoms since starting PT. Baseline:  Goal status: INITIAL  3.  Pt will report 0/10 pain with vaginal penetration Baseline:  Goal status: INITIAL  4.  Pt will be able to cough, sneeze, laugh and walk to the bathroom with 0 leakage Baseline:  Goal status: INITIAL   PLAN:  PT FREQUENCY: 1-2x/week  PT DURATION:  12 sessions  PLANNED INTERVENTIONS: 97164- PT Re-evaluation, 97110-Therapeutic exercises, 97530- Therapeutic activity, 97112- Neuromuscular re-education, 97535- Self Care, 86578- Manual therapy, [id number]- Gait training, [phone]- Canalith repositioning, [id number]- Aquatic Therapy, 97014- Electrical stimulation (unattended), [id number]- Electrical stimulation (manual), [id number]- Ultrasound, [id number]- Traction (mechanical), [id number]- Ionotophoresis 4mg /ml Dexamethasone, Patient/Family education, Balance training, Stair training, Taping, Dry Needling, Joint mobilization, Joint manipulation, Spinal manipulation, Spinal mobilization, Vestibular training, Cryotherapy, Moist heat, and Biofeedback  PLAN FOR NEXT SESSION: pelvic floor lengthening, perineal massage education, trial of dry needling, urge drill  Asim Gersten, PT 12/09/22 2:07 PM

## 2022-12-09 NOTE — Patient Instructions (Signed)
Dilators: 1. Find a comfortable position and work on diaphragmatic breathing: you can lie on your back propped up on pillows and knees supported as a good first position to try 2. Start with your finger and a little bit of lubricant: gentle massage the vaginal entrance before use of dilator 3. Wash dilators before and after you use them; then place a little bit of lubricant along the dilator on all sides 4. Gently press the very tip of the dilator into the vagina - stop with any pain or discomfort 5. First goal is to get dilator inside vagina: you can use the back and forth (side to side) movement to help gently press dilator forward. 6. Once the dilator is inside the vaginal canal, just lie there and take some deep breaths, read something, listen to music, etc - distract yourself! 7. Ways to progress: a. Bigger side to side b. Tilting c. Twisting d. Thrusting - does not have to be the length of the dilator at first - start with just a small movement 8. If you get stuck and there's pain: leave the dilator where it is and try to ignore it - distract yourself and work on breathing. If you cannot get a dilator comfortable, go down in size. The primary goal is to make this a positive experience and end on a good note.

## 2022-12-12 ENCOUNTER — Other Ambulatory Visit: Payer: Self-pay | Admitting: Family Medicine

## 2022-12-12 DIAGNOSIS — E039 Hypothyroidism, unspecified: Secondary | ICD-10-CM

## 2022-12-15 ENCOUNTER — Telehealth: Payer: Self-pay | Admitting: Family Medicine

## 2022-12-15 NOTE — Telephone Encounter (Signed)
Prescription Request  12/15/2022  LOV: 03/24/2022  What is the name of the medication or equipment? levothyroxine (SYNTHROID) 112 MCG tablet [[id number]]   Have you contacted your pharmacy to request a refill? Yes   Which pharmacy would you like this sent to?  [id number] [address] [address], [place] - [address] [place] [address] Phone: [phone] Fax: [phone]    Patient notified that their request is being sent to the clinical staff for review and that they should receive a response within 2 business days.   Please advise at Mobile [phone] (mobile)

## 2022-12-15 NOTE — Telephone Encounter (Signed)
Called patient and scheduled;ed her an appointment for 8:20 am 12/16/22.  Dm/cma

## 2022-12-16 ENCOUNTER — Encounter: Payer: Self-pay | Admitting: Family Medicine

## 2022-12-16 ENCOUNTER — Ambulatory Visit: Payer: 59 | Admitting: Family Medicine

## 2022-12-16 ENCOUNTER — Ambulatory Visit: Payer: 59 | Admitting: Physical Therapy

## 2022-12-16 VITALS — BP 122/84 | HR 83 | Temp 97.8°F | Ht 67.0 in | Wt 152.8 lb

## 2022-12-16 DIAGNOSIS — Z23 Encounter for immunization: Secondary | ICD-10-CM | POA: Diagnosis not present

## 2022-12-16 DIAGNOSIS — M62838 Other muscle spasm: Secondary | ICD-10-CM

## 2022-12-16 DIAGNOSIS — E039 Hypothyroidism, unspecified: Secondary | ICD-10-CM | POA: Insufficient documentation

## 2022-12-16 DIAGNOSIS — H8110 Benign paroxysmal vertigo, unspecified ear: Secondary | ICD-10-CM | POA: Insufficient documentation

## 2022-12-16 DIAGNOSIS — J301 Allergic rhinitis due to pollen: Secondary | ICD-10-CM | POA: Diagnosis not present

## 2022-12-16 DIAGNOSIS — M6281 Muscle weakness (generalized): Secondary | ICD-10-CM

## 2022-12-16 DIAGNOSIS — N941 Unspecified dyspareunia: Secondary | ICD-10-CM | POA: Diagnosis not present

## 2022-12-16 DIAGNOSIS — R278 Other lack of coordination: Secondary | ICD-10-CM

## 2022-12-16 LAB — TSH: TSH: 0.06 u[IU]/mL — ABNORMAL LOW (ref 0.35–5.50)

## 2022-12-16 MED ORDER — FLUTICASONE PROPIONATE 50 MCG/ACT NA SUSP: 2.0000 | Freq: Every day | NASAL | 0 refills | Status: DC

## 2022-12-16 MED ORDER — LEVOTHYROXINE SODIUM 112 MCG PO TABS: ORAL_TABLET | ORAL | 1 refills | Status: DC

## 2022-12-16 MED ORDER — LEVOTHYROXINE SODIUM 100 MCG PO TABS: 100.0000 ug | ORAL_TABLET | Freq: Every day | ORAL | 1 refills | Status: DC

## 2022-12-16 NOTE — Progress Notes (Addendum)
Established Patient Office Visit   Subjective:  Patient ID: Melinda Morales, female    DOB: 08/08/1965  Age: 57 y.o. MRN: [id number]  Chief Complaint  Patient presents with   Medical Management of Chronic Issues    3 month follow up. Pt needs rx for Synthroid and Flonase. Flu shot today.     HPI Encounter Diagnoses  Name Primary?   Seasonal allergic rhinitis due to pollen Yes   Hypothyroidism, unspecified type    Benign paroxysmal positional vertigo, unspecified laterality    Need for immunization against influenza    Here for follow-up of above.  Continues to experience occasional palpitations.  She has eliminated caffeine.  TSH is below 1.  Increased stress with full-time work and caring for her invalid 42 year old mother who is recovering from her recent hip fracture.  She spends each night with her.  [name] passed recently.  Recent coronary artery scan with a 0 calcium score.  She has been having some issues with benign positional vertigo and tinnitus.  Denies significant headaches or change in hearing.  She had heard that physical therapy can be helpful   Review of Systems  Constitutional: Negative.   HENT: Negative.  Negative for hearing loss.   Eyes:  Negative for blurred vision, discharge and redness.  Respiratory: Negative.  Negative for shortness of breath.   Cardiovascular:  Positive for palpitations. Negative for chest pain.  Gastrointestinal:  Negative for abdominal pain.  Genitourinary: Negative.   Musculoskeletal: Negative.  Negative for myalgias.  Skin:  Negative for rash.  Neurological:  Positive for dizziness. Negative for tingling, loss of consciousness, weakness and headaches.  Endo/Heme/Allergies:  Negative for polydipsia.     Current Outpatient Medications:    atorvastatin (LIPITOR) 20 MG tablet, TAKE 1 TABLET BY MOUTH EVERY DAY, Disp: 90 tablet, Rfl: 3   Calcium Carbonate (CALCIUM 500 PO), Take 500 mg by mouth., Disp: , Rfl:     cetirizine (ZYRTEC) 10 MG tablet, Take 10 mg by mouth daily., Disp: , Rfl:    Cholecalciferol (VITAMIN D-1000 MAX ST) 25 MCG (1000 UT) tablet, Take by mouth., Disp: , Rfl:    fish oil-omega-3 fatty acids 1000 MG capsule, Take 2 g by mouth daily., Disp: , Rfl:    metoprolol succinate (TOPROL XL) 25 MG 24 hr tablet, Take 0.5 tablets (12.5 mg total) by mouth daily., Disp: 45 tablet, Rfl: 3   Multiple Vitamin (MULTI-VITAMIN) tablet, Take 1 tablet by mouth daily., Disp: , Rfl:    YUVAFEM 10 MCG TABS vaginal tablet, PLACE 1 TABLET (10 MCG TOTAL) VAGINALLY AT BEDTIME. NIGHTLY FOR 2 WEEKS THEN 2 NIGHTS A WEEK, Disp: 16 tablet, Rfl: 12   fluticasone (FLONASE) 50 MCG/ACT nasal spray, Place 2 sprays into both nostrils daily., Disp: 1 mL, Rfl: 0   levothyroxine (SYNTHROID) 100 MCG tablet, Take 1 tablet (100 mcg total) by mouth daily., Disp: 90 tablet, Rfl: 1   Objective:     BP 122/84   Pulse 83   Temp 97.8 F (36.6 C)   Ht 5\' 7"  (1.702 m)   Wt 152 lb 12.8 oz (69.3 kg)   SpO2 97%   BMI 23.93 kg/m    Physical Exam Constitutional:      General: She is not in acute distress.    Appearance: Normal appearance. She is not ill-appearing, toxic-appearing or diaphoretic.  HENT:     Head: Normocephalic and atraumatic.     Right Ear: External ear normal.  Left Ear: External ear normal.  Eyes:     General: No scleral icterus.       Right eye: No discharge.        Left eye: No discharge.     Extraocular Movements: Extraocular movements intact.     Conjunctiva/sclera: Conjunctivae normal.  Cardiovascular:     Rate and Rhythm: Normal rate and regular rhythm.  Pulmonary:     Effort: Pulmonary effort is normal. No respiratory distress.     Breath sounds: No wheezing, rhonchi or rales.  Skin:    General: Skin is warm and dry.  Neurological:     Mental Status: She is alert and oriented to person, place, and time.  Psychiatric:        Mood and Affect: Mood normal.        Behavior: Behavior normal.       Results for orders placed or performed in visit on 12/16/22  TSH  Result Value Ref Range   TSH 0.06 (L) 0.35 - 5.50 uIU/mL      The 10-year ASCVD risk score (Arnett DK, et al., 2019) is: 1.8%    Assessment & Plan:   Seasonal allergic rhinitis due to pollen -     Fluticasone Propionate; Place 2 sprays into both nostrils daily.  Dispense: 1 mL; Refill: 0  Hypothyroidism, unspecified type -     TSH -     Levothyroxine Sodium; Take 1 tablet (100 mcg total) by mouth daily.  Dispense: 90 tablet; Refill: 1  Benign paroxysmal positional vertigo, unspecified laterality -     Ambulatory referral to Physical Therapy  Need for immunization against influenza -     Flu vaccine trivalent PF, 6mos and older(Flulaval,Afluria,Fluarix,Fluzone)    Return in about 3 months (around 03/18/2023).  Decrease levothyroxine to Monday through Saturday.  Mliss Sax, MD

## 2022-12-16 NOTE — Therapy (Signed)
OUTPATIENT PHYSICAL THERAPY FEMALE PELVIC EVALUATION   Patient Name: Melinda Morales MRN: [id number] DOB:12/05/1965, 57 y.o., female Today's Date: 12/16/2022  END OF SESSION:  PT End of Session - 12/16/22 1319     Visit Number 3    Authorization Type aetna [id number] no auth req    PT Start Time 1230    PT Stop Time 1319    PT Time Calculation (min) 49 min    Activity Tolerance Patient tolerated treatment well    Behavior During Therapy [facility] for tasks assessed/performed               Past Medical History:  Diagnosis Date   Hyperlipidemia    Hypertension    Tachycardia 10/2021   Thyroid disease    Past Surgical History:  Procedure Laterality Date   ANKLE ARTHROSCOPY     TONSILLECTOMY     Patient Active Problem List   Diagnosis Date Noted   Hypothyroidism 12/16/2022   Seasonal allergic rhinitis due to pollen 12/16/2022   Benign paroxysmal positional vertigo 12/16/2022   Hyperlipidemia 04/09/2022   Obstructive sleep apnea 01/01/2022   Atypical chest pain 11/12/2021   Palpitations 11/12/2021   Dyspnea on exertion 11/12/2021    PCP: Mliss Sax, MD  REFERRING PROVIDER: Lorriane Shire, MD  REFERRING DIAG: N94.10 (ICD-10-CM) - Dyspareunia in female   THERAPY DIAG:  Muscle weakness (generalized)  Other lack of coordination  Other muscle spasm  Rationale for Evaluation and Treatment: Rehabilitation  ONSET DATE:spring 2023 SUBJECTIVE:                                                                                                                                                                                           SUBJECTIVE STATEMENT: Pt reports that she had a busy morning, doctor's appts.  Doing her exercises.  Not doing manual stretching.  Still having urinary urgency, it's some better. Pt reports that she has had some plantar fasciitis.  Tension in bilateral upper traps.  Embarrased to talk to her husband about pelvic pain.  Busy  taking care of her mom , lives with her , mom has dementia      Pt reports that the exercises were hard on her knees, she has had some knee pain as she gets older and difficulty with squatting and getting up from the squat. Pt is doing an estrogen pill vaginally.  No vaginal moisturizers yet.    Pt reports that she has had dyspareunia and urinary incontinence - when she has to go, she has to go. Never has had PT before. Has had  urinary urgency and  frequency as well.  She has also had a lot of dizziness and ringing in her ears. Has seen and ENT  Fluid intake: to be asked   PAIN:  Are you having pain? Yes sometimes her knees, her shoulder, her right elbow, has a bone spur, ringing of her ears NPRS scale: 8/10 Pain location: Internal, Deep, Bilateral, and Vaginal  Pain type: burning Pain description: intermittent and burning   Aggravating factors: intercourse Relieving factors: no intercourse  PRECAUTIONS: None  RED FLAGS: None   WEIGHT BEARING RESTRICTIONS: No  FALLS:  Has patient fallen in last 6 months? No  LIVING ENVIRONMENT: Lives with: lives with their spouse Lives in: House/apartment Stairs: No Has following equipment at home: None  OCCUPATION: lawyer  PLOF: Independent  PATIENT GOALS: to not have pain with IC  PERTINENT HISTORY:  Pt fractured her ankle about a year and half ago, symptoms got worse then Sexual abuse: no  BOWEL MOVEMENT: No issues  URINATION: Pain with urination: No Fully empty bladder: Yes: maybe not Stream: Strong Urgency: Yes:   Frequency: yes Leakage: Urge to void, Walking to the bathroom, Coughing, Sneezing, Laughing, and Bending forward Pads: No  INTERCOURSE: Pain with intercourse: Initial Penetration, During Penetration, After Intercourse, During Climax, and Pain Interrupts Intercourse Ability to have vaginal penetration:  Yes:   Climax: yes Marinoff Scale: 2/3  PREGNANCY: Vaginal deliveries 0 Tearing No C-section  deliveries 0 Currently pregnant No  PROLAPSE: None   OBJECTIVE:  Note: Objective measures were completed at Evaluation unless otherwise noted.     COGNITION: Overall cognitive status: Within functional limits for tasks assessed     SENSATION: Light touch: Appears intact Proprioception: Appears intact  MUSCLE LENGTH: Hamstrings: Right 70 deg; Left 80 deg   POSTURE: rounded shoulders, forward head, decreased lumbar lordosis, and posterior pelvic tilt  PELVIC ALIGNMENT: even  LUMBARAROM/PROM:  A/PROM A/PROM  eval  Flexion Within functional limitations   Extension Within functional limitations   Right lateral flexion   Left lateral flexion   Right rotation   Left rotation    (Blank rows = not tested)  LOWER EXTREMITY ROM: some tightness throughout   LOWER EXTREMITY MMT: grossly 4/5 overall  PALPATION:   General within functional limitations                 External Perineal Exam seems within functional limitations                              Internal Pelvic Floor tight and tender superficial layers and perineum  Patient confirms identification and approves PT to assess internal pelvic floor and treatment Yes  PELVIC MMT:   MMT eval  Vaginal 4/5  Internal Anal Sphincter   External Anal Sphincter   Puborectalis   Diastasis Recti   (Blank rows = not tested)        TONE: High, Tps bilateral Uts Upper chest breathing   PROLAPSE:no  Breathing- upper chest  TODAY'S TREATMENT:  DATE: 12/16/22   EVAL see above Manual-dry needling bilateral Uts and bilateral Ras STM     Neuro reed-diaphragmatic breathing with thera band Happy baby on a wall        Therapeutic activities- education on dry needling, stress, relevant anatomy, importance of communication with husband  Manual- perineal massage demonstrated and pt educated  on,  Therapeutic exercises- happy baby, child's pose, DB, reverse kegel   Therapeutic activities- dilator use, lubricants, exercises, wand  PATIENT EDUCATION:  Education details: exam findings, relevant anatomy, exercises Person educated: Patient Education method: Explanation, Demonstration, Tactile cues, Verbal cues, and Handouts Education comprehension: verbalized understanding and needs further education Trigger Point Dry-Needling  Treatment instructions: Expect mild to moderate muscle soreness. S/S of pneumothorax if dry needled over a lung field, and to seek immediate medical attention should they occur. Patient verbalized understanding of these instructions and education.  Patient Consent Given: Yes Education handout provided: Yes Muscles treated: Uts and RAs Treatment response/outcome: Utilized skilled palpation to identify trigger points.  During dry needling able to palpate muscle twitch and muscle elongation   Skilled palpation and monitoring by PT during dry needling   HOME EXERCISE PROGRAM: [id number]  ASSESSMENT:  CLINICAL IMPRESSION: Tx focus- bilateral upper trap and rectus abdominis trial, diaphragmatic breathing and pelvic floor lengthening neuro reed. Pt with TPs in Ras and UTs, upper chest breathing, abdominal and pelvic floor gripping. Added new exercises to HEP. Improved urgency.     Pt with tight and tender all layers of pelvic floor, good contraction, difficulty with reverse Kegel and pelvic floor lengthening. Educated her today on using wand or dilators to help with down training to reduce pain with intercourse. Discussed trial of dry needling next appt. Pt reported that she is not sure if she would do dilators or wand, does not own a vibrator. Pt seems uncomfortable and embarrassed discussing pelvic issues, she will continue to benefit from PT.   Patient is a 57 y.o. F who was seen today for physical therapy evaluation and treatment for dyspareunia. She also  presents with urge incontinence, urinary urgency and frequency and incomplete bladder emptying. She reported that she is inactive and has lost 35 lbs with weight watchers this year. Pt did well with internal pelvic floor assessment - she has some tightness in superficial pelvic floor muscles. Educated on perineal massage, lubricants and vaginal estrogen.   Patient with reports of tinnitus and dizziness that she has had since last August.  She was treated for BPPV once , it helped a little bit, has done some home treatment, but has not had much success.  Patient may benefit from a Vestibular Assessment and canalith repositioning during her PT admission.  OBJECTIVE IMPAIRMENTS: decreased coordination, decreased knowledge of condition, decreased ROM, decreased strength, increased fascial restrictions, increased muscle spasms, and pain.   ACTIVITY LIMITATIONS: continence and toileting  PARTICIPATION LIMITATIONS: interpersonal relationship  PERSONAL FACTORS: Time since onset of injury/illness/exacerbation are also affecting patient's functional outcome.   REHAB POTENTIAL: Good  CLINICAL DECISION MAKING: Stable/uncomplicated  EVALUATION COMPLEXITY: Low   GOALS: Goals reviewed with patient? Yes  SHORT TERM GOALS: Target date: 12/30/2022   Pt will be I and consistent with her initial HEP  Baseline: Goal status: INITIAL  2.  Patient will undergo a vestibular evaluation, if dizziness persists. Baseline:  Goal status: INITIAL  3.  Pt will be I with urge drill Baseline:  Goal status: INITIAL  4.  Pt will report reduced pain with intercourse to max 3/10  Baseline:  Goal status: INITIAL   LONG TERM GOALS: Target date: 02/24/2023    Pt will soak 0 pads/ day Baseline:  Goal status: INITIAL  2.  Patient will report at least an 80% improvement in dizziness symptoms since starting PT. Baseline:  Goal status: INITIAL  3.  Pt will report 0/10 pain with vaginal penetration Baseline:  Goal  status: INITIAL  4.  Pt will be able to cough, sneeze, laugh and walk to the bathroom with 0 leakage Baseline:  Goal status: INITIAL   PLAN:  PT FREQUENCY: 1-2x/week  PT DURATION:  12 sessions  PLANNED INTERVENTIONS: 97164- PT Re-evaluation, 97110-Therapeutic exercises, 97530- Therapeutic activity, 97112- Neuromuscular re-education, 97535- Self Care, 78295- Manual therapy, [id number]- Gait training, [phone]- Canalith repositioning, [id number]- Aquatic Therapy, 97014- Electrical stimulation (unattended), [id number]- Electrical stimulation (manual), [id number]- Ultrasound, [id number]- Traction (mechanical), [id number]- Ionotophoresis 4mg /ml Dexamethasone, Patient/Family education, Balance training, Stair training, Taping, Dry Needling, Joint mobilization, Joint manipulation, Spinal manipulation, Spinal mobilization, Vestibular training, Cryotherapy, Moist heat, and Biofeedback  PLAN FOR NEXT SESSION: pelvic floor lengthening, perineal massage education, trial of dry needling, urge drill  Javayah Magaw, PT 12/16/22 1:24 PM

## 2022-12-16 NOTE — Addendum Note (Signed)
Addended by: Andrez Grime on: 12/16/2022 05:16 PM   Modules accepted: Orders

## 2022-12-23 ENCOUNTER — Ambulatory Visit: Payer: 59 | Admitting: Physical Therapy

## 2022-12-23 DIAGNOSIS — M62838 Other muscle spasm: Secondary | ICD-10-CM

## 2022-12-23 DIAGNOSIS — N941 Unspecified dyspareunia: Secondary | ICD-10-CM | POA: Diagnosis not present

## 2022-12-23 DIAGNOSIS — M6281 Muscle weakness (generalized): Secondary | ICD-10-CM

## 2022-12-23 DIAGNOSIS — R278 Other lack of coordination: Secondary | ICD-10-CM

## 2022-12-23 NOTE — Therapy (Signed)
OUTPATIENT PHYSICAL THERAPY FEMALE PELVIC EVALUATION   Patient Name: Melinda Morales MRN: [id number] DOB:[birth year], 57 y.o., female Today's Date: 12/23/2022  END OF SESSION:  PT End of Session - 12/23/22 1007     Visit Number 4    Authorization Type aetna 2024 no auth req    PT Start Time 0930    PT Stop Time 1015    PT Time Calculation (min) 45 min    Activity Tolerance Patient tolerated treatment well    Behavior During Therapy [facility] for tasks assessed/performed                Past Medical History:  Diagnosis Date   Hyperlipidemia    Hypertension    Tachycardia 10/2021   Thyroid disease    Past Surgical History:  Procedure Laterality Date   ANKLE ARTHROSCOPY     TONSILLECTOMY     Patient Active Problem List   Diagnosis Date Noted   Hypothyroidism 12/16/2022   Seasonal allergic rhinitis due to pollen 12/16/2022   Benign paroxysmal positional vertigo 12/16/2022   Hyperlipidemia 04/09/2022   Obstructive sleep apnea 01/01/2022   Atypical chest pain 11/12/2021   Palpitations 11/12/2021   Dyspnea on exertion 11/12/2021    PCP: Mliss Sax, MD  REFERRING PROVIDER: Lorriane Shire, MD  REFERRING DIAG: N94.10 (ICD-10-CM) - Dyspareunia in female   THERAPY DIAG:  Muscle weakness (generalized)  Other lack of coordination  Other muscle spasm  Rationale for Evaluation and Treatment: Rehabilitation  ONSET DATE:spring 2023 SUBJECTIVE:                                                                                                                                                                                           SUBJECTIVE STATEMENT: Pt reports that her mom is 27 yo It's whole thing. Her mom has cognitive issues. She reports that she got her estrogen prescription, she thinks it's helping.  Has been doing her exercises, has been not giving in to the urgency, urgency is better.  She felt good after DN.  Has had some urgency Gets up at  night with her mom, but would get up once/night or 2/ night.      Pt reports that she had a busy morning, doctor's appts.  Doing her exercises.  Not doing manual stretching.  Still having urinary urgency, it's some better. Pt reports that she has had some plantar fasciitis.  Tension in bilateral upper traps.  Embarrased to talk to her husband about pelvic pain.  Busy taking care of her mom , lives with her , mom has dementia      Pt reports that  the exercises were hard on her knees, she has had some knee pain as she gets older and difficulty with squatting and getting up from the squat. Pt is doing an estrogen pill vaginally.  No vaginal moisturizers yet.    Pt reports that she has had dyspareunia and urinary incontinence - when she has to go, she has to go. Never has had PT before. Has had  urinary urgency and  frequency as well.  She has also had a lot of dizziness and ringing in her ears. Has seen and ENT  Fluid intake: to be asked   PAIN:  Are you having pain? Yes sometimes her knees, her shoulder, her right elbow, has a bone spur, ringing of her ears NPRS scale: 8/10 Pain location: Internal, Deep, Bilateral, and Vaginal  Pain type: burning Pain description: intermittent and burning   Aggravating factors: intercourse Relieving factors: no intercourse  PRECAUTIONS: None  RED FLAGS: None   WEIGHT BEARING RESTRICTIONS: No  FALLS:  Has patient fallen in last 6 months? No  LIVING ENVIRONMENT: Lives with: lives with their spouse Lives in: House/apartment Stairs: No Has following equipment at home: None  OCCUPATION: lawyer  PLOF: Independent  PATIENT GOALS: to not have pain with IC  PERTINENT HISTORY:  Pt fractured her ankle about a year and half ago, symptoms got worse then Sexual abuse: no  BOWEL MOVEMENT: No issues  URINATION: Pain with urination: No Fully empty bladder: Yes: maybe not Stream: Strong Urgency: Yes:   Frequency: yes Leakage:  Urge to void, Walking to the bathroom, Coughing, Sneezing, Laughing, and Bending forward Pads: No  INTERCOURSE: Pain with intercourse: Initial Penetration, During Penetration, After Intercourse, During Climax, and Pain Interrupts Intercourse Ability to have vaginal penetration:  Yes:   Climax: yes Marinoff Scale: 2/3  PREGNANCY: Vaginal deliveries 0 Tearing No C-section deliveries 0 Currently pregnant No  PROLAPSE: None   OBJECTIVE:  Note: Objective measures were completed at Evaluation unless otherwise noted.     COGNITION: Overall cognitive status: Within functional limits for tasks assessed     SENSATION: Light touch: Appears intact Proprioception: Appears intact  MUSCLE LENGTH: Hamstrings: Right 70 deg; Left 80 deg   POSTURE: rounded shoulders, forward head, decreased lumbar lordosis, and posterior pelvic tilt  PELVIC ALIGNMENT: even  LUMBARAROM/PROM:  A/PROM A/PROM  eval  Flexion Within functional limitations   Extension Within functional limitations   Right lateral flexion   Left lateral flexion   Right rotation   Left rotation    (Blank rows = not tested)  LOWER EXTREMITY ROM: some tightness throughout   LOWER EXTREMITY MMT: grossly 4/5 overall  PALPATION:   General within functional limitations                 External Perineal Exam seems within functional limitations                              Internal Pelvic Floor tight and tender superficial layers and perineum  Patient confirms identification and approves PT to assess internal pelvic floor and treatment Yes  PELVIC MMT:   MMT eval  Vaginal 4/5  Internal Anal Sphincter   External Anal Sphincter   Puborectalis   Diastasis Recti   (Blank rows = not tested)        TONE: High, Tps bilateral Uts Upper chest breathing   PROLAPSE:no  Breathing- upper chest  TODAY'S TREATMENT:  DATE: 12/23/22   EVAL see above   Exercises: child's pose, cat/ cow  Therapeutic activities: urge drill, trial of comfy tens   Manual-dry needling bilateral Uts and bilateral Ras STM     Neuro reed-diaphragmatic breathing with thera band Happy baby on a wall        Therapeutic activities- education on dry needling, stress, relevant anatomy, importance of communication with husband  Manual- perineal massage demonstrated and pt educated on,  Therapeutic exercises- happy baby, child's pose, DB, reverse kegel   Therapeutic activities- dilator use, lubricants, exercises, wand  PATIENT EDUCATION:  Education details: exam findings, relevant anatomy, exercises Person educated: Patient Education method: Explanation, Demonstration, Tactile cues, Verbal cues, and Handouts Education comprehension: verbalized understanding and needs further education Trigger Point Dry-Needling  Treatment instructions: Expect mild to moderate muscle soreness. S/S of pneumothorax if dry needled over a lung field, and to seek immediate medical attention should they occur. Patient verbalized understanding of these instructions and education.  Patient Consent Given: Yes Education handout provided: Yes Muscles treated: Uts and RAs Treatment response/outcome: Utilized skilled palpation to identify trigger points.  During dry needling able to palpate muscle twitch and muscle elongation   Skilled palpation and monitoring by PT during dry needling   HOME EXERCISE PROGRAM: [id number]  ASSESSMENT:  CLINICAL IMPRESSION: Tx focus- urge drill, trail of comfy tens for urinary urgency.  Pt progressing well, reported less pain with intercourse as she is taking estrogen pill.    Pt with tight and tender all layers of pelvic floor, good contraction, difficulty with reverse Kegel and pelvic floor lengthening. Educated her today on using wand or dilators to help with down training to reduce  pain with intercourse. Discussed trial of dry needling next appt. Pt reported that she is not sure if she would do dilators or wand, does not own a vibrator. Pt seems uncomfortable and embarrassed discussing pelvic issues, she will continue to benefit from PT.   Patient is a 57 y.o. F who was seen today for physical therapy evaluation and treatment for dyspareunia. She also presents with urge incontinence, urinary urgency and frequency and incomplete bladder emptying. She reported that she is inactive and has lost 35 lbs with weight watchers this year. Pt did well with internal pelvic floor assessment - she has some tightness in superficial pelvic floor muscles. Educated on perineal massage, lubricants and vaginal estrogen.   Patient with reports of tinnitus and dizziness that she has had since last August.  She was treated for BPPV once , it helped a little bit, has done some home treatment, but has not had much success.  Patient may benefit from a Vestibular Assessment and canalith repositioning during her PT admission.  OBJECTIVE IMPAIRMENTS: decreased coordination, decreased knowledge of condition, decreased ROM, decreased strength, increased fascial restrictions, increased muscle spasms, and pain.   ACTIVITY LIMITATIONS: continence and toileting  PARTICIPATION LIMITATIONS: interpersonal relationship  PERSONAL FACTORS: Time since onset of injury/illness/exacerbation are also affecting patient's functional outcome.   REHAB POTENTIAL: Good  CLINICAL DECISION MAKING: Stable/uncomplicated  EVALUATION COMPLEXITY: Low   GOALS: Goals reviewed with patient? Yes  SHORT TERM GOALS: Target date: 12/30/2022   Pt will be I and consistent with her initial HEP  Baseline: Goal status: INITIAL  2.  Patient will undergo a vestibular evaluation, if dizziness persists. Baseline:  Goal status: INITIAL  3.  Pt will be I with urge drill Baseline:  Goal status: INITIAL  4.  Pt will report reduced  pain with  intercourse to max 3/10 Baseline:  Goal status: INITIAL   LONG TERM GOALS: Target date: 02/24/2023    Pt will soak 0 pads/ day Baseline:  Goal status: INITIAL  2.  Patient will report at least an 80% improvement in dizziness symptoms since starting PT. Baseline:  Goal status: INITIAL  3.  Pt will report 0/10 pain with vaginal penetration Baseline:  Goal status: INITIAL  4.  Pt will be able to cough, sneeze, laugh and walk to the bathroom with 0 leakage Baseline:  Goal status: INITIAL   PLAN:  PT FREQUENCY: 1-2x/week  PT DURATION:  12 sessions  PLANNED INTERVENTIONS: 97164- PT Re-evaluation, 97110-Therapeutic exercises, 97530- Therapeutic activity, 97112- Neuromuscular re-education, 97535- Self Care, 16109- Manual therapy, [id number]- Gait training, [id number]- Canalith repositioning, [id number]- Aquatic Therapy, 97014- Electrical stimulation (unattended), [id number]- Electrical stimulation (manual), [id number]- Ultrasound, [id number]- Traction (mechanical), [id number]- Ionotophoresis 4mg /ml Dexamethasone, Patient/Family education, Balance training, Stair training, Taping, Dry Needling, Joint mobilization, Joint manipulation, Spinal manipulation, Spinal mobilization, Vestibular training, Cryotherapy, Moist heat, and Biofeedback  PLAN FOR NEXT SESSION: pelvic floor lengthening, perineal massage education, trial of dry needling, urge drill  Haniyah Maciolek, PT 12/23/22 10:07 AM

## 2023-01-02 ENCOUNTER — Ambulatory Visit: Payer: 59 | Admitting: Obstetrics and Gynecology

## 2023-01-07 ENCOUNTER — Other Ambulatory Visit: Payer: Self-pay

## 2023-01-07 ENCOUNTER — Ambulatory Visit: Payer: 59 | Attending: Obstetrics and Gynecology

## 2023-01-07 DIAGNOSIS — H8110 Benign paroxysmal vertigo, unspecified ear: Secondary | ICD-10-CM | POA: Insufficient documentation

## 2023-01-07 DIAGNOSIS — R2681 Unsteadiness on feet: Secondary | ICD-10-CM | POA: Diagnosis present

## 2023-01-07 DIAGNOSIS — R42 Dizziness and giddiness: Secondary | ICD-10-CM | POA: Diagnosis present

## 2023-01-07 NOTE — Therapy (Signed)
OUTPATIENT PHYSICAL THERAPY VESTIBULAR EVALUATION     Patient Name: Melinda Morales MRN: [id number] DOB:26-Dec-1965, 57 y.o., female Today's Date: 01/07/2023  END OF SESSION:  PT End of Session - 01/07/23 1015     Visit Number 1    Number of Visits 5    Date for PT Re-Evaluation 02/04/23    Authorization Type aetna [id number] no auth req    PT Start Time 1015    PT Stop Time 1100    PT Time Calculation (min) 45 min             Past Medical History:  Diagnosis Date   Hyperlipidemia    Hypertension    Tachycardia 10/2021   Thyroid disease    Past Surgical History:  Procedure Laterality Date   ANKLE ARTHROSCOPY     TONSILLECTOMY     Patient Active Problem List   Diagnosis Date Noted   Hypothyroidism 12/16/2022   Seasonal allergic rhinitis due to pollen 12/16/2022   Benign paroxysmal positional vertigo 12/16/2022   Hyperlipidemia 04/09/2022   Obstructive sleep apnea 01/01/2022   Atypical chest pain 11/12/2021   Palpitations 11/12/2021   Dyspnea on exertion 11/12/2021    PCP: Mliss Sax, MD REFERRING PROVIDER: Mliss Sax, MD  REFERRING DIAG:  H81.10 (ICD-10-CM) - Benign paroxysmal positional vertigo, unspecified laterality    THERAPY DIAG:  Dizziness and giddiness  Unsteadiness on feet  ONSET DATE: Spring 2023  Rationale for Evaluation and Treatment: Rehabilitation  SUBJECTIVE:   SUBJECTIVE STATEMENT: Began experiencing BPPV in Spring 2023 and notes correlation with hx of sinus problems. Was seen by ENT office for treatment of the onset of dizziness with report of modest benefit. Since onset, dizziness has been intermittent and notes onset with certain positions and movements. Notes reclining back in chair triggers episodes, bending forward, and quick movements of head/body. Notes consistent tinnitus in bilat ears, unsure if HA accompany symptoms.  Pt accompanied by: self  PERTINENT HISTORY: sinus issues, SVT, thyroid  dysfunction  PAIN:  Are you having pain? No  PRECAUTIONS: None  RED FLAGS: None   WEIGHT BEARING RESTRICTIONS: No  FALLS: Has patient fallen in last 6 months? No  LIVING ENVIRONMENT: Lives with: lives with their family Lives in: House/apartment Stairs:  yes Has following equipment at home: None  PLOF: Independent. 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  DIAGNOSTIC FINDINGS: N/A for current episode  COGNITION: Overall cognitive status: Within functional limits for tasks assessed   SENSATION: WFL  EDEMA:  none  MUSCLE TONE:  WNL  DTRs:  NT  POSTURE:  No Significant postural limitations  Cervical ROM:   WNL--slight symptoms with extension  STRENGTH: NT   BED MOBILITY:  indep  TRANSFERS: Independent    GAIT: Gait pattern: WFL Distance walked:  Assistive device utilized: None Level of assistance: Complete Independence Comments:   FUNCTIONAL TESTS:    PATIENT SURVEYS:  FOTO 56  VESTIBULAR ASSESSMENT:  GENERAL OBSERVATION: wears progressive lens glasses   SYMPTOM BEHAVIOR:  Subjective history: onset spring 2023  Non-Vestibular symptoms: tinnitus  Type of dizziness: Spinning/Vertigo, Unsteady with head/body turns, and "Funny feeling in the head"  Frequency: varies  Duration: seconds  Aggravating factors: Induced by position change: lying supine, rolling to the right, rolling to the left, and supine to sit  Relieving factors: slow movements  Progression of symptoms:  varies  OCULOMOTOR EXAM:  Ocular  Alignment: normal  Ocular ROM: No Limitations  Spontaneous Nystagmus: absent  Gaze-Induced Nystagmus: absent  Smooth Pursuits: intact  Saccades: intact  Convergence/Divergence: 6 cm     VESTIBULAR - OCULAR REFLEX:   Slow VOR: Comment: symptomatic horizontal and vertical  VOR Cancellation: Normal  Head-Impulse  Test: HIT Right: negative HIT Left: positive  Dynamic Visual Acuity: Not able to be assessed   POSITIONAL TESTING: Right Dix-Hallpike: no nystagmus Left Dix-Hallpike: no nystagmus Right Roll Test: no nystagmus Left Roll Test: no nystagmus  MOTION SENSITIVITY:  Motion Sensitivity Quotient Intensity: 0 = none, 1 = Lightheaded, 2 = Mild, 3 = Moderate, 4 = Severe, 5 = Vomiting Notes lightheadedness when arising from test positions  OTHOSTATICS: not done  FUNCTIONAL GAIT: Functional gait assessment: TBD  M-CTSIB  Condition 1: Firm Surface, EO 30 Sec, Mild Sway  Condition 2: Firm Surface, EC 30 Sec, Moderate Sway  Condition 3: Foam Surface, EO 30 Sec, Mild Sway  Condition 4: Foam Surface, EC 30 Sec, Moderate and Severe Sway     VESTIBULAR TREATMENT:                                                                                                   DATE: 01/07/23  See below for HEP initiation and review  PATIENT EDUCATION: Education details: assessment details, rationale of treatment, HEP initiation and review Person educated: Patient Education method: Explanation, Demonstration, and Handouts Education comprehension: verbalized understanding and needs further education  HOME EXERCISE PROGRAM: Access Code: [email or web address] URL: [email or web address] Date: 01/07/2023 Prepared by: Shary Decamp  Exercises - Brandt-Daroff Vestibular Exercise  - 1 x daily - 7 x weekly - 5 reps - Seated Gaze Stabilization with Head Rotation  - 1 x daily - 7 x weekly - 3-5 sets - 30 sec hold - Seated Gaze Stabilization with Head Nod  - 1 x daily - 7 x weekly - 3-5 sets - 30 sec hold  GOALS: Goals reviewed with patient? Yes  SHORT TERM GOALS: Target date: same as LTG    LONG TERM GOALS: Target date: 02/04/2023    The patient will be independent with HEP for gaze adaptation, habituation, balance, and general mobility. Baseline:  Goal status: INITIAL  2.  Manifest improved symptoms per  meeting expected outcomes FOTO survey Baseline: 56 (predicted 63) Goal status: INITIAL  3.  Demo improved postural stability as evidenced by mild sway condition 4 M-CTSIB to improve safety with ADL Baseline: mod-severe x 30 sec Goal status: INITIAL  4.  Demonstrate improved mobility and reduced balance deficits per score 28/30 Functional Gait Assessment Baseline: TBD Goal status: INITIAL    ASSESSMENT:  CLINICAL IMPRESSION: Patient is a 57 y.o. lady who was seen today for physical therapy evaluation and treatment for dizziness and unsteadiness on feet.  Demonstrates normal oculomotor exam but symptomatic with VOR testing and symptomatic with positional changes from testing but no nystagmus or experience of vertigo reproduced.  Exhibits positive left Head Impulse Test as compared to normal response on right.  Postural instability evident under demands of M-CTSIB with greatest disturbance under condition 4 exhibiting  moderate-severe sway.  Impacted daily function evident by score of FOTO survey as compared to predicted outcome.  Patient would benefit from vestibular therapy to address symptoms and improve mobility and activity tolerance.   OBJECTIVE IMPAIRMENTS: decreased activity tolerance, decreased balance, and dizziness.   ACTIVITY LIMITATIONS: lifting, bending, transfers, bed mobility, reach over head, and locomotion level  PARTICIPATION LIMITATIONS: meal prep, cleaning, interpersonal relationship, driving, and community activity  PERSONAL FACTORS: Time since onset of injury/illness/exacerbation and 1-2 comorbidities: PMH of thyroid and cardiac dysfunction  are also affecting patient's functional outcome.   REHAB POTENTIAL: Good  CLINICAL DECISION MAKING: Stable/uncomplicated  EVALUATION COMPLEXITY: Low   PLAN:  PT FREQUENCY: 1x/week  PT DURATION: 4 weeks  PLANNED INTERVENTIONS: 97110-Therapeutic exercises, 97530- Therapeutic activity, 97112- Neuromuscular re-education,  97535- Self Care, 09604- Manual therapy, [phone]- Gait training, [phone]- Canalith repositioning, [phone]- Aquatic Therapy, Patient/Family education, Balance training, Stair training, Taping, Dry Needling, Joint mobilization, Spinal mobilization, Vestibular training, and DME instructions  PLAN FOR NEXT SESSION: Functional Gait Assessment, HEP review, corner balance   12:14 PM, 01/07/23 M. Shary Decamp, PT, DPT Physical Therapist- [place] Office Number: [phone]

## 2023-01-13 ENCOUNTER — Ambulatory Visit: Payer: 59 | Admitting: Physical Therapy

## 2023-01-14 ENCOUNTER — Other Ambulatory Visit: Payer: Self-pay | Admitting: Family Medicine

## 2023-01-14 DIAGNOSIS — J301 Allergic rhinitis due to pollen: Secondary | ICD-10-CM

## 2023-01-20 ENCOUNTER — Encounter: Payer: Self-pay | Admitting: Physical Therapy

## 2023-01-20 ENCOUNTER — Ambulatory Visit: Payer: 59 | Admitting: Physical Therapy

## 2023-01-20 DIAGNOSIS — R42 Dizziness and giddiness: Secondary | ICD-10-CM | POA: Diagnosis not present

## 2023-01-20 DIAGNOSIS — R2681 Unsteadiness on feet: Secondary | ICD-10-CM

## 2023-01-20 NOTE — Therapy (Signed)
OUTPATIENT PHYSICAL THERAPY VESTIBULAR TREATMENT NOTE     Patient Name: Melinda Morales MRN: [id number] DOB:1965-09-29, 57 y.o., female Today's Date: 01/20/2023  END OF SESSION:  PT End of Session - 01/20/23 0838     Visit Number 2    Number of Visits 5    Date for PT Re-Evaluation 02/04/23    Authorization Type aetna 2024 no auth req    PT Start Time 0840    PT Stop Time 0925    PT Time Calculation (min) 45 min    Activity Tolerance Patient tolerated treatment well    Behavior During Therapy [facility] for tasks assessed/performed              Past Medical History:  Diagnosis Date   Hyperlipidemia    Hypertension    Tachycardia 10/2021   Thyroid disease    Past Surgical History:  Procedure Laterality Date   ANKLE ARTHROSCOPY     TONSILLECTOMY     Patient Active Problem List   Diagnosis Date Noted   Hypothyroidism 12/16/2022   Seasonal allergic rhinitis due to pollen 12/16/2022   Benign paroxysmal positional vertigo 12/16/2022   Hyperlipidemia 04/09/2022   Obstructive sleep apnea 01/01/2022   Atypical chest pain 11/12/2021   Palpitations 11/12/2021   Dyspnea on exertion 11/12/2021    PCP: Mliss Sax, MD REFERRING PROVIDER: Mliss Sax, MD  REFERRING DIAG:  H81.10 (ICD-10-CM) - Benign paroxysmal positional vertigo, unspecified laterality    THERAPY DIAG:  Dizziness and giddiness  Unsteadiness on feet  ONSET DATE: Spring 2023  Rationale for Evaluation and Treatment: Rehabilitation  SUBJECTIVE:   SUBJECTIVE STATEMENT: Things are going okay.  Did the exercises.    Pt accompanied by: self  PERTINENT HISTORY: sinus issues, SVT, thyroid dysfunction  PAIN:  Are you having pain? No  PRECAUTIONS: None  RED FLAGS: None   WEIGHT BEARING RESTRICTIONS: No  FALLS: Has patient fallen in last 6 months? No  LIVING ENVIRONMENT: Lives with: lives with their family Lives in: House/apartment Stairs:  yes Has following equipment  at home: None  PLOF: Independent. 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  PATIENT GOALS: reduce symptoms  OBJECTIVE:   TODAY'S TREATMENT: 01/20/2023 Activity Comments  FGA 26/30 See below  HEP Review Brandt-Daroff to L and R   Gaze stabilization  Rates 3/10>less second rep, to L Rates 2/10>1/10 second rep, to R  Cues for correct technique, symptoms 3/10 horizontal Symptoms 3/10 vertical  Corner balance: Feet apart EO/EC 30 sec Feet together EO/EC 30 sec Feet together EC head turns/nods Mild sway   Mod sway with head nods EC              [facility] PT Assessment - 01/20/23 0845       Functional Gait  Assessment   Gait assessed  Yes    Gait Level Surface Walks 20 ft in less than 7 sec but greater than 5.5 sec, uses assistive device, slower speed, mild gait deviations, or deviates 6-10 in outside of the 12 in walkway width.   6.37   Change in Gait Speed Able to smoothly change walking speed without loss of balance or gait deviation. Deviate no more than 6 in outside of the 12 in walkway width.    Gait with Horizontal Head Turns Performs head turns smoothly with no change in gait. Deviates no more than 6 in outside 12 in walkway width    Gait with Vertical Head Turns Performs task with slight change in gait  velocity (eg, minor disruption to smooth gait path), deviates 6 - 10 in outside 12 in walkway width or uses assistive device    Gait and Pivot Turn Pivot turns safely within 3 sec and stops quickly with no loss of balance.    Step Over Obstacle Is able to step over 2 stacked shoe boxes taped together (9 in total height) without changing gait speed. No evidence of imbalance.    Gait with Narrow Base of Support Is able to ambulate for 10 steps heel to toe with no staggering.    Gait with Eyes Closed Walks 20 ft, uses assistive device, slower speed, mild gait deviations, deviates 6-10 in outside 12 in walkway width. Ambulates 20 ft in less than 9 sec but greater than 7 sec.   8.96  sec   Ambulating Backwards Walks 20 ft, no assistive devices, good speed, no evidence for imbalance, normal gait    Steps Alternating feet, must use rail.    Total Score 26            Access Code: [email or web address] URL: [email or web address] Date: 01/20/2023 Prepared by: [facility] - [facility]  Exercises - Brandt-Daroff Vestibular Exercise  - 1 x daily - 7 x weekly - 5 reps - Seated Gaze Stabilization with Head Rotation  - 1 x daily - 7 x weekly - 3-5 sets - 30 sec hold - Seated Gaze Stabilization with Head Nod  - 1 x daily - 7 x weekly - 3-5 sets - 30 sec hold - Corner Balance Feet Together: Eyes Closed With Head Turns  - 1 x daily - 7 x weekly - 1 sets - 3 reps - 30 sec hold  PATIENT EDUCATION: Education details: HEP  Person educated: Patient Education method: Explanation, Demonstration, and Handouts Education comprehension: verbalized understanding and returned demonstration  --------------------------------------------------------------- Note: Objective measures below were completed at Evaluation unless otherwise noted.  DIAGNOSTIC FINDINGS: N/A for current episode  COGNITION: Overall cognitive status: Within functional limits for tasks assessed   SENSATION: WFL  EDEMA:  none  MUSCLE TONE:  WNL  DTRs:  NT  POSTURE:  No Significant postural limitations  Cervical ROM:   WNL--slight symptoms with extension  STRENGTH: NT   BED MOBILITY:  indep  TRANSFERS: Independent    GAIT: Gait pattern: WFL Distance walked:  Assistive device utilized: None Level of assistance: Complete Independence Comments:   FUNCTIONAL TESTS:    PATIENT SURVEYS:  FOTO 56  VESTIBULAR ASSESSMENT:  GENERAL OBSERVATION: wears progressive lens glasses   SYMPTOM BEHAVIOR:  Subjective history: onset spring 2023  Non-Vestibular symptoms: tinnitus  Type of dizziness: Spinning/Vertigo, Unsteady with head/body turns, and "Funny feeling in the  head"  Frequency: varies  Duration: seconds  Aggravating factors: Induced by position change: lying supine, rolling to the right, rolling to the left, and supine to sit  Relieving factors: slow movements  Progression of symptoms:  varies  OCULOMOTOR EXAM:  Ocular Alignment: normal  Ocular ROM: No Limitations  Spontaneous Nystagmus: absent  Gaze-Induced Nystagmus: absent  Smooth Pursuits: intact  Saccades: intact  Convergence/Divergence: 6 cm     VESTIBULAR - OCULAR REFLEX:   Slow VOR: Comment: symptomatic horizontal and vertical  VOR Cancellation: Normal  Head-Impulse Test: HIT Right: negative HIT Left: positive  Dynamic Visual Acuity: Not able to be assessed   POSITIONAL TESTING: Right Dix-Hallpike: no nystagmus Left Dix-Hallpike: no nystagmus Right Roll Test: no nystagmus Left Roll Test: no nystagmus  MOTION SENSITIVITY:  Motion Sensitivity  Quotient Intensity: 0 = none, 1 = Lightheaded, 2 = Mild, 3 = Moderate, 4 = Severe, 5 = Vomiting Notes lightheadedness when arising from test positions  OTHOSTATICS: not done  FUNCTIONAL GAIT: Functional gait assessment: TBD  M-CTSIB  Condition 1: Firm Surface, EO 30 Sec, Mild Sway  Condition 2: Firm Surface, EC 30 Sec, Moderate Sway  Condition 3: Foam Surface, EO 30 Sec, Mild Sway  Condition 4: Foam Surface, EC 30 Sec, Moderate and Severe Sway     VESTIBULAR TREATMENT:                                                                                                   DATE: 01/07/23  See below for HEP initiation and review  PATIENT EDUCATION: Education details: assessment details, rationale of treatment, HEP initiation and review Person educated: Patient Education method: Explanation, Demonstration, and Handouts Education comprehension: verbalized understanding and needs further education  HOME EXERCISE PROGRAM: Access Code: [email or web address] URL: [email or web address] Date: 01/07/2023 Prepared by: Shary Decamp  Exercises - Brandt-Daroff Vestibular Exercise  - 1 x daily - 7 x weekly - 5 reps - Seated Gaze Stabilization with Head Rotation  - 1 x daily - 7 x weekly - 3-5 sets - 30 sec hold - Seated Gaze Stabilization with Head Nod  - 1 x daily - 7 x weekly - 3-5 sets - 30 sec hold  GOALS: Goals reviewed with patient? Yes  SHORT TERM GOALS: Target date: same as LTG    LONG TERM GOALS: Target date: 02/04/2023    The patient will be independent with HEP for gaze adaptation, habituation, balance, and general mobility. Baseline:  Goal status: INITIAL  2.  Manifest improved symptoms per meeting expected outcomes FOTO survey Baseline: 56 (predicted 63) Goal status: INITIAL  3.  Demo improved postural stability as evidenced by mild sway condition 4 M-CTSIB to improve safety with ADL Baseline: mod-severe x 30 sec Goal status: INITIAL  4.  Demonstrate improved mobility and reduced balance deficits per score 28/30 Functional Gait Assessment Baseline: TBD Goal status: INITIAL    ASSESSMENT:  CLINICAL IMPRESSION: Pt presents today with slightly improved symptoms. Skilled PT session focused on review of HEP and progression to corner balance exercises. Pt needs min cues for technique of gaze stabilization.  Her current HEP brings on about 3/10 symptoms and remains appropriate.  Added corner balance exercises, as narrowed BOS with EC/head motions bring on moderate sway.   Pt will continue to benefit from skilled PT towards goals for improved functional mobility and decreased fall risk.   OBJECTIVE IMPAIRMENTS: decreased activity tolerance, decreased balance, and dizziness.   ACTIVITY LIMITATIONS: lifting, bending, transfers, bed mobility, reach over head, and locomotion level  PARTICIPATION LIMITATIONS: meal prep, cleaning, interpersonal relationship, driving, and community activity  PERSONAL FACTORS: Time since onset of injury/illness/exacerbation and 1-2 comorbidities: PMH of thyroid and  cardiac dysfunction  are also affecting patient's functional outcome.   REHAB POTENTIAL: Good  CLINICAL DECISION MAKING: Stable/uncomplicated  EVALUATION COMPLEXITY: Low   PLAN:  PT FREQUENCY: 1x/week  PT DURATION: 4 weeks  PLANNED INTERVENTIONS: 97110-Therapeutic  exercises, 97530- Therapeutic activity, [id number]- Neuromuscular re-education, [phone]- Self Care, 30865- Manual therapy, [phone]- Gait training, [phone]- Canalith repositioning, [phone]- Aquatic Therapy, Patient/Family education, Balance training, Stair training, Taping, Dry Needling, Joint mobilization, Spinal mobilization, Vestibular training, and DME instructions  PLAN FOR NEXT SESSION: HEP review, corner balance progression; gaze stabilization progression to standing   Lonia Blood, PT 01/20/23 9:31 AM Phone: [phone] Fax: [phone]  [facility] [address], [address] [place], [place] [address] Phone # [phone] Fax # [phone]

## 2023-01-27 ENCOUNTER — Encounter: Payer: Self-pay | Admitting: Physical Therapy

## 2023-01-27 ENCOUNTER — Ambulatory Visit: Payer: 59 | Admitting: Physical Therapy

## 2023-01-27 DIAGNOSIS — R42 Dizziness and giddiness: Secondary | ICD-10-CM

## 2023-01-27 DIAGNOSIS — R2681 Unsteadiness on feet: Secondary | ICD-10-CM

## 2023-01-27 NOTE — Therapy (Signed)
 OUTPATIENT PHYSICAL THERAPY VESTIBULAR TREATMENT NOTE     Patient Name: Melinda Morales MRN: [id number] DOB:[birth year], 57 y.o., female Today's Date: 01/27/2023  END OF SESSION:  PT End of Session - 01/27/23 1019     Visit Number 3    Number of Visits 5    Date for PT Re-Evaluation 02/04/23    Authorization Type aetna [id number] no auth req    PT Start Time 1020    PT Stop Time 1058    PT Time Calculation (min) 38 min    Activity Tolerance Patient tolerated treatment well    Behavior During Therapy [facility] for tasks assessed/performed               Past Medical History:  Diagnosis Date   Hyperlipidemia    Hypertension    Tachycardia 10/2021   Thyroid  disease    Past Surgical History:  Procedure Laterality Date   ANKLE ARTHROSCOPY     TONSILLECTOMY     Patient Active Problem List   Diagnosis Date Noted   Hypothyroidism 12/16/2022   Seasonal allergic rhinitis due to pollen 12/16/2022   Benign paroxysmal positional vertigo 12/16/2022   Hyperlipidemia 04/09/2022   Obstructive sleep apnea 01/01/2022   Atypical chest pain 11/12/2021   Palpitations 11/12/2021   Dyspnea on exertion 11/12/2021    PCP: Berneta Elsie Sayre, MD REFERRING PROVIDER: Berneta Elsie Sayre, MD  REFERRING DIAG:  H81.10 (ICD-10-CM) - Benign paroxysmal positional vertigo, unspecified laterality    THERAPY DIAG:  Dizziness and giddiness  Unsteadiness on feet  ONSET DATE: Spring 2023  Rationale for Evaluation and Treatment: Rehabilitation  SUBJECTIVE:   SUBJECTIVE STATEMENT: Doing okay today.  Have some sinus issues and one of my eyes is red.    Pt accompanied by: self  PERTINENT HISTORY: sinus issues, SVT, thyroid  dysfunction  PAIN:  Are you having pain? No  PRECAUTIONS: None  RED FLAGS: None   WEIGHT BEARING RESTRICTIONS: No  FALLS: Has patient fallen in last 6 months? No  LIVING ENVIRONMENT: Lives with: lives with their family Lives in: House/apartment Stairs:   yes Has following equipment at home: None  PLOF: Independent. 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  PATIENT GOALS: reduce symptoms  OBJECTIVE:    TODAY'S TREATMENT: 01/27/2023 Activity Comments  Gaze stabilization standing feet apart Horizontal x 30 sec Vertical x 30 sec   Mild symptoms of pulling to L No symptoms  Gaze stabilization feet together: Horizontal x 30 sec Vertical x 30 sec  More instability, mild sway Mild to no sway  Reviewed standing feet together/EC head motions Good form, mild sway (slightly more sway with horizontal)  Corner balance on foam: Feet apart EO/EC head turns Feet together EO head turns/EC head steady x 30 sec   Nose to R knee x 5 No symptoms  Nose to L knee x 5 No symptoms  Gait with head turns, head nods Mild sway    Access Code: [email or web address] URL: [email or web address] Date: 01/27/2023 Prepared by: [facility] - Outpatient  Rehab - [facility]  Exercises - Brandt-Daroff Vestibular Exercise  - 1 x daily - 7 x weekly - 5 reps - Corner Balance Feet Together: Eyes Closed With Head Turns  - 1 x daily - 7 x weekly - 1 sets - 3 reps - 30 sec hold - Romberg Stance on Foam Pad with Head Rotation  - 1 x daily - 7 x weekly - 1-2 sets - 10 reps - Romberg Stance Eyes Closed on Foam Pad  -  1 x daily - 7 x weekly - 3 sets - 30 sec hold - Walking with Head Rotation  - 1 x daily - 7 x weekly - 1 sets - 3 reps    - 1 x daily - 7 x weekly - 1 sets - 3 reps - 30 sec hold  PATIENT EDUCATION: Education details: HEP updates Person educated: Patient Education method: Explanation, Demonstration, and Handouts Education comprehension: verbalized understanding and returned demonstration  --------------------------------------------------------------- Note: Objective measures below were completed at Evaluation unless otherwise noted.  DIAGNOSTIC FINDINGS: N/A for current episode  COGNITION: Overall cognitive status: Within functional limits for  tasks assessed   SENSATION: WFL  EDEMA:  none  MUSCLE TONE:  WNL  DTRs:  NT  POSTURE:  No Significant postural limitations  Cervical ROM:   WNL--slight symptoms with extension  STRENGTH: NT   BED MOBILITY:  indep  TRANSFERS: Independent    GAIT: Gait pattern: WFL Distance walked:  Assistive device utilized: None Level of assistance: Complete Independence Comments:   FUNCTIONAL TESTS:    PATIENT SURVEYS:  FOTO 56  VESTIBULAR ASSESSMENT:  GENERAL OBSERVATION: wears progressive lens glasses   SYMPTOM BEHAVIOR:  Subjective history: onset spring 2023  Non-Vestibular symptoms: tinnitus  Type of dizziness: Spinning/Vertigo, Unsteady with head/body turns, and Funny feeling in the head  Frequency: varies  Duration: seconds  Aggravating factors: Induced by position change: lying supine, rolling to the right, rolling to the left, and supine to sit  Relieving factors: slow movements  Progression of symptoms:  varies  OCULOMOTOR EXAM:  Ocular Alignment: normal  Ocular ROM: No Limitations  Spontaneous Nystagmus: absent  Gaze-Induced Nystagmus: absent  Smooth Pursuits: intact  Saccades: intact  Convergence/Divergence: 6 cm     VESTIBULAR - OCULAR REFLEX:   Slow VOR: Comment: symptomatic horizontal and vertical  VOR Cancellation: Normal  Head-Impulse Test: HIT Right: negative HIT Left: positive  Dynamic Visual Acuity: Not able to be assessed   POSITIONAL TESTING: Right Dix-Hallpike: no nystagmus Left Dix-Hallpike: no nystagmus Right Roll Test: no nystagmus Left Roll Test: no nystagmus  MOTION SENSITIVITY:  Motion Sensitivity Quotient Intensity: 0 = none, 1 = Lightheaded, 2 = Mild, 3 = Moderate, 4 = Severe, 5 = Vomiting Notes lightheadedness when arising from test positions  OTHOSTATICS: not done  FUNCTIONAL GAIT: Functional gait assessment: TBD  M-CTSIB  Condition 1: Firm Surface, EO 30 Sec, Mild Sway  Condition 2: Firm Surface, EC 30 Sec,  Moderate Sway  Condition 3: Foam Surface, EO 30 Sec, Mild Sway  Condition 4: Foam Surface, EC 30 Sec, Moderate and Severe Sway     VESTIBULAR TREATMENT:                                                                                                   DATE: 01/07/23  See below for HEP initiation and review  PATIENT EDUCATION: Education details: assessment details, rationale of treatment, HEP initiation and review Person educated: Patient Education method: Explanation, Demonstration, and Handouts Education comprehension: verbalized understanding and needs further education  HOME EXERCISE PROGRAM: Access Code: [email or web address] URL: [email or web address] Date:  01/07/2023 Prepared by: Kelly Halpin  Exercises - Brandt-Daroff Vestibular Exercise  - 1 x daily - 7 x weekly - 5 reps - Seated Gaze Stabilization with Head Rotation  - 1 x daily - 7 x weekly - 3-5 sets - 30 sec hold - Seated Gaze Stabilization with Head Nod  - 1 x daily - 7 x weekly - 3-5 sets - 30 sec hold  GOALS: Goals reviewed with patient? Yes  SHORT TERM GOALS: Target date: same as LTG    LONG TERM GOALS: Target date: 02/04/2023    The patient will be independent with HEP for gaze adaptation, habituation, balance, and general mobility. Baseline:  Goal status: INITIAL  2.  Manifest improved symptoms per meeting expected outcomes FOTO survey Baseline: 56 (predicted 63) Goal status: INITIAL  3.  Demo improved postural stability as evidenced by mild sway condition 4 M-CTSIB to improve safety with ADL Baseline: mod-severe x 30 sec Goal status: INITIAL  4.  Demonstrate improved mobility and reduced balance deficits per score 28/30 Functional Gait Assessment Baseline: TBD Goal status: INITIAL    ASSESSMENT:  CLINICAL IMPRESSION: Pt presents today reporting overall improved symptoms, still some mild feelings of being off when she lies back onto recliner.  No overt dizziness reports.  Skilled PT session  focused on progression of exercises; discharged seated gaze stabilization as this doesn't bring on dizziness.  Progressed corner balance exercises to foam and continued with vision removed.  Pt has more unsteadiness with eyes closed, tending to lean to left.  Pt experiences mild sway with gait with head turns/nods.  Updated HEP to include these exercises.  Pt will continue to benefit from skilled PT towards goals for improved functional mobility and decreased fall risk.    OBJECTIVE IMPAIRMENTS: decreased activity tolerance, decreased balance, and dizziness.   ACTIVITY LIMITATIONS: lifting, bending, transfers, bed mobility, reach over head, and locomotion level  PARTICIPATION LIMITATIONS: meal prep, cleaning, interpersonal relationship, driving, and community activity  PERSONAL FACTORS: Time since onset of injury/illness/exacerbation and 1-2 comorbidities: PMH of thyroid  and cardiac dysfunction  are also affecting patient's functional outcome.   REHAB POTENTIAL: Good  CLINICAL DECISION MAKING: Stable/uncomplicated  EVALUATION COMPLEXITY: Low   PLAN:  PT FREQUENCY: 1x/week  PT DURATION: 4 weeks  PLANNED INTERVENTIONS: 97110-Therapeutic exercises, 97530- Therapeutic activity, 97112- Neuromuscular re-education, 97535- Self Care, 02859- Manual therapy, [phone]- Gait training, [phone]- Canalith repositioning, [phone]- Aquatic Therapy, Patient/Family education, Balance training, Stair training, Taping, Dry Needling, Joint mobilization, Spinal mobilization, Vestibular training, and DME instructions  PLAN FOR NEXT SESSION: Check LTGs and discuss for discharge or 30-day hold if pt continues to not improved symptoms.  HEP review, corner balance progression  Greig Anon, PT 01/27/23 10:59 AM Phone: [phone] Fax: [phone]  [facility] at [facility] [address] [place], [address] [place], [place] [address] Phone # [phone] Fax # [phone]

## 2023-02-02 NOTE — Therapy (Signed)
 OUTPATIENT PHYSICAL THERAPY VESTIBULAR DISCHARGE NOTE     Patient Name: Melinda Morales MRN: [id number] DOB:August 19, 1965, 58 y.o., female Today's Date: 02/03/2023   END OF SESSION:  PT End of Session - 02/03/23 1049     Visit Number 4    Number of Visits 5    Date for PT Re-Evaluation 02/04/23    Authorization Type aetna 2024 no auth req    PT Start Time 1017    PT Stop Time 1049    PT Time Calculation (min) 32 min    Activity Tolerance Patient tolerated treatment well    Behavior During Therapy [facility] for tasks assessed/performed                Past Medical History:  Diagnosis Date   Hyperlipidemia    Hypertension    Tachycardia 10/2021   Thyroid  disease    Past Surgical History:  Procedure Laterality Date   ANKLE ARTHROSCOPY     TONSILLECTOMY     Patient Active Problem List   Diagnosis Date Noted   Hypothyroidism 12/16/2022   Seasonal allergic rhinitis due to pollen 12/16/2022   Benign paroxysmal positional vertigo 12/16/2022   Hyperlipidemia 04/09/2022   Obstructive sleep apnea 01/01/2022   Atypical chest pain 11/12/2021   Palpitations 11/12/2021   Dyspnea on exertion 11/12/2021    PCP: Berneta Elsie Sayre, MD REFERRING PROVIDER: Berneta Elsie Sayre, MD  REFERRING DIAG:  H81.10 (ICD-10-CM) - Benign paroxysmal positional vertigo, unspecified laterality    THERAPY DIAG:  Dizziness and giddiness  Unsteadiness on feet  ONSET DATE: Spring 2023  Rationale for Evaluation and Treatment: Rehabilitation  SUBJECTIVE:   SUBJECTIVE STATEMENT: No specific changes, I think I'm okay. Reports occasional dizziness, but less. Reports that sometimes when lying back in a reclining chair she gets a little dizzy. Also sometimes bending forward to pick something off the floor.      Pt accompanied by: self  PERTINENT HISTORY: sinus issues, SVT, thyroid  dysfunction  PAIN:  Are you having pain? No  PRECAUTIONS: None  RED FLAGS: None   WEIGHT  BEARING RESTRICTIONS: No  FALLS: Has patient fallen in last 6 months? No  LIVING ENVIRONMENT: Lives with: lives with their family Lives in: House/apartment Stairs:  yes Has following equipment at home: None  PLOF: Independent. 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  PATIENT GOALS: reduce symptoms  OBJECTIVE:     TODAY'S TREATMENT: 02/03/23 Activity Comments  L DH Possible few beats upbeating torsional but asymptomatic  R DH Negative   L sidelying test  negative  R sidelying test  Possible 1 beat upbeating torsional but asymptomatic  FOTO 62.1725  FGA 29/30              M-CTSIB  Condition 1: Firm Surface, EO - Sec,  -  Sway  Condition 2: Firm Surface, EC - Sec,  -  Sway  Condition 3: Foam Surface, EO 30 Sec, Normal Sway  Condition 4: Foam Surface, EC 30 Sec, Mild and Moderate Sway    [facility] PT Assessment - 02/03/23 0001       Functional Gait  Assessment   Gait Level Surface Walks 20 ft in less than 5.5 sec, no assistive devices, good speed, no evidence for imbalance, normal gait pattern, deviates no more than 6 in outside of the 12 in walkway width.    Change in Gait Speed Able to smoothly change walking speed without loss of balance or gait deviation. Deviate no more than 6 in outside of  the 12 in walkway width.    Gait with Horizontal Head Turns Performs head turns smoothly with no change in gait. Deviates no more than 6 in outside 12 in walkway width    Gait with Vertical Head Turns Performs head turns with no change in gait. Deviates no more than 6 in outside 12 in walkway width.    Gait and Pivot Turn Pivot turns safely within 3 sec and stops quickly with no loss of balance.    Step Over Obstacle Is able to step over 2 stacked shoe boxes taped together (9 in total height) without changing gait speed. No evidence of imbalance.    Gait with Narrow Base of Support Is able to ambulate for 10 steps heel to toe with no staggering.    Gait with Eyes Closed Walks 20 ft, uses  assistive device, slower speed, mild gait deviations, deviates 6-10 in outside 12 in walkway width. Ambulates 20 ft in less than 9 sec but greater than 7 sec.    Ambulating Backwards Walks 20 ft, no assistive devices, good speed, no evidence for imbalance, normal gait    Steps Alternating feet, no rail.    Total Score 29              HOME EXERCISE PROGRAM Last updated: 02/03/23 Access Code: [email or web address] URL: [email or web address] Date: 02/03/2023 Prepared by: [facility] - Outpatient  Rehab - [facility]  Exercises - Corner Balance Feet Together: Eyes Closed With Head Turns  - 1 x daily - 7 x weekly - 1 sets - 3 reps - 30 sec hold - Romberg Stance on Foam Pad with Head Rotation  - 1 x daily - 7 x weekly - 1-2 sets - 10 reps - Romberg Stance Eyes Closed on Foam Pad  - 1 x daily - 7 x weekly - 3 sets - 30 sec hold    PATIENT EDUCATION: Education details: progress towards goals and remaining impairments; HEP consolidation  Person educated: Patient Education method: Explanation, Demonstration, Tactile cues, Verbal cues, and Handouts Education comprehension: verbalized understanding and returned demonstration     --------------------------------------------------------------- Note: Objective measures below were completed at Evaluation unless otherwise noted.  DIAGNOSTIC FINDINGS: N/A for current episode  COGNITION: Overall cognitive status: Within functional limits for tasks assessed   SENSATION: WFL  EDEMA:  none  MUSCLE TONE:  WNL  DTRs:  NT  POSTURE:  No Significant postural limitations  Cervical ROM:   WNL--slight symptoms with extension  STRENGTH: NT   BED MOBILITY:  indep  TRANSFERS: Independent    GAIT: Gait pattern: WFL Distance walked:  Assistive device utilized: None Level of assistance: Complete Independence Comments:   FUNCTIONAL TESTS:    PATIENT SURVEYS:  FOTO 56  VESTIBULAR ASSESSMENT:  GENERAL OBSERVATION:  wears progressive lens glasses   SYMPTOM BEHAVIOR:  Subjective history: onset spring 2023  Non-Vestibular symptoms: tinnitus  Type of dizziness: Spinning/Vertigo, Unsteady with head/body turns, and Funny feeling in the head  Frequency: varies  Duration: seconds  Aggravating factors: Induced by position change: lying supine, rolling to the right, rolling to the left, and supine to sit  Relieving factors: slow movements  Progression of symptoms:  varies  OCULOMOTOR EXAM:  Ocular Alignment: normal  Ocular ROM: No Limitations  Spontaneous Nystagmus: absent  Gaze-Induced Nystagmus: absent  Smooth Pursuits: intact  Saccades: intact  Convergence/Divergence: 6 cm     VESTIBULAR - OCULAR REFLEX:   Slow VOR: Comment: symptomatic horizontal and vertical  VOR Cancellation: Normal  Head-Impulse  Test: HIT Right: negative HIT Left: positive  Dynamic Visual Acuity: Not able to be assessed   POSITIONAL TESTING: Right Dix-Hallpike: no nystagmus Left Dix-Hallpike: no nystagmus Right Roll Test: no nystagmus Left Roll Test: no nystagmus  MOTION SENSITIVITY:  Motion Sensitivity Quotient Intensity: 0 = none, 1 = Lightheaded, 2 = Mild, 3 = Moderate, 4 = Severe, 5 = Vomiting Notes lightheadedness when arising from test positions  OTHOSTATICS: not done  FUNCTIONAL GAIT: Functional gait assessment: TBD  M-CTSIB  Condition 1: Firm Surface, EO 30 Sec, Mild Sway  Condition 2: Firm Surface, EC 30 Sec, Moderate Sway  Condition 3: Foam Surface, EO 30 Sec, Mild Sway  Condition 4: Foam Surface, EC 30 Sec, Moderate and Severe Sway     VESTIBULAR TREATMENT:                                                                                                   DATE: 01/07/23  See below for HEP initiation and review  PATIENT EDUCATION: Education details: assessment details, rationale of treatment, HEP initiation and review Person educated: Patient Education method: Explanation, Demonstration, and  Handouts Education comprehension: verbalized understanding and needs further education  HOME EXERCISE PROGRAM: Access Code: [email or web address] URL: [email or web address] Date: 01/07/2023 Prepared by: Kelly Halpin  Exercises - Brandt-Daroff Vestibular Exercise  - 1 x daily - 7 x weekly - 5 reps - Seated Gaze Stabilization with Head Rotation  - 1 x daily - 7 x weekly - 3-5 sets - 30 sec hold - Seated Gaze Stabilization with Head Nod  - 1 x daily - 7 x weekly - 3-5 sets - 30 sec hold  GOALS: Goals reviewed with patient? Yes  SHORT TERM GOALS: Target date: same as LTG    LONG TERM GOALS: Target date: 02/04/2023    The patient will be independent with HEP for gaze adaptation, habituation, balance, and general mobility. Baseline:  Goal status: MET 02/03/23  2.  Manifest improved symptoms per meeting expected outcomes FOTO survey Baseline: 56 (predicted 63); 62.1725 02/03/23 Goal status: NOT MET  02/03/23   3.  Demo improved postural stability as evidenced by mild sway condition 4 M-CTSIB to improve safety with ADL Baseline: mod-severe x 30 sec; mild-mod sway 02/03/23 Goal status: MET 02/03/23  4.  Demonstrate improved mobility and reduced balance deficits per score 28/30 Functional Gait Assessment Baseline: 29/30 02/03/23 Goal status: MET  02/03/23    ASSESSMENT:  CLINICAL IMPRESSION: Patient arrived to session with report of improved dizziness but occasional symptoms when lying back in a reclining chair and bending forward. Positional testing asymptomatic. Balance testing improved and indicates a decreased risk of falls. HEP was consolidated to include exercises that she feels are still a good challenge. At this time patient reports improved symptoms and has met most goals. Now ready for DC.    OBJECTIVE IMPAIRMENTS: decreased activity tolerance, decreased balance, and dizziness.   ACTIVITY LIMITATIONS: lifting, bending, transfers, bed mobility, reach over head, and locomotion  level  PARTICIPATION LIMITATIONS: meal prep, cleaning, interpersonal relationship, driving, and community activity  PERSONAL FACTORS: Time since onset of  injury/illness/exacerbation and 1-2 comorbidities: PMH of thyroid  and cardiac dysfunction  are also affecting patient's functional outcome.   REHAB POTENTIAL: Good  CLINICAL DECISION MAKING: Stable/uncomplicated  EVALUATION COMPLEXITY: Low   PLAN:  PT FREQUENCY: 1x/week  PT DURATION: 4 weeks  PLANNED INTERVENTIONS: 97110-Therapeutic exercises, 97530- Therapeutic activity, 97112- Neuromuscular re-education, 97535- Self Care, 02859- Manual therapy, [phone]- Gait training, [phone]- Canalith repositioning, [phone]- Aquatic Therapy, Patient/Family education, Balance training, Stair training, Taping, Dry Needling, Joint mobilization, Spinal mobilization, Vestibular training, and DME instructions  PLAN FOR NEXT SESSION: DC at this time    PHYSICAL THERAPY DISCHARGE SUMMARY  Visits from Start of Care: 4  Current functional level related to goals / functional outcomes: See above clinical impression   Remaining deficits: Mild remaining dizziness  Education / Equipment: HEP  Plan: Patient agrees to discharge.  Patient goals were partially met. Patient is being discharged due to meeting the stated rehab goals.      Louana Terrilyn Christians, PT, DPT 02/03/23 10:52 AM  [place] [facility] at [facility] [address] [place], [address] [place], [place] [address] Phone # [phone] Fax # [phone]

## 2023-02-03 ENCOUNTER — Ambulatory Visit: Payer: 59 | Attending: Obstetrics and Gynecology | Admitting: Physical Therapy

## 2023-02-03 ENCOUNTER — Encounter: Payer: Self-pay | Admitting: Physical Therapy

## 2023-02-03 DIAGNOSIS — R2681 Unsteadiness on feet: Secondary | ICD-10-CM | POA: Diagnosis present

## 2023-02-03 DIAGNOSIS — R42 Dizziness and giddiness: Secondary | ICD-10-CM | POA: Diagnosis present

## 2023-02-15 ENCOUNTER — Other Ambulatory Visit: Payer: Self-pay | Admitting: Family Medicine

## 2023-02-15 DIAGNOSIS — J301 Allergic rhinitis due to pollen: Secondary | ICD-10-CM

## 2023-02-25 ENCOUNTER — Telehealth: Payer: 59 | Admitting: Physician Assistant

## 2023-02-25 DIAGNOSIS — H9201 Otalgia, right ear: Secondary | ICD-10-CM

## 2023-02-25 DIAGNOSIS — J0100 Acute maxillary sinusitis, unspecified: Secondary | ICD-10-CM | POA: Diagnosis not present

## 2023-02-25 MED ORDER — IPRATROPIUM BROMIDE 0.03 % NA SOLN: 2.0000 | Freq: Two times a day (BID) | NASAL | 0 refills | Status: AC

## 2023-02-25 MED ORDER — DOXYCYCLINE HYCLATE 100 MG PO TABS: 100.0000 mg | ORAL_TABLET | Freq: Two times a day (BID) | ORAL | 0 refills | Status: AC

## 2023-02-25 NOTE — Progress Notes (Signed)
Virtual Visit Consent   Melinda Morales, you are scheduled for a virtual visit with a  provider today. Just as with appointments in the office, your consent must be obtained to participate. Your consent will be active for this visit and any virtual visit you may have with one of our providers in the next 365 days. If you have a MyChart account, a copy of this consent can be sent to you electronically.  As this is a virtual visit, video technology does not allow for your provider to perform a traditional examination. This may limit your provider's ability to fully assess your condition. If your provider identifies any concerns that need to be evaluated in person or the need to arrange testing (such as labs, EKG, etc.), we will make arrangements to do so. Although advances in technology are sophisticated, we cannot ensure that it will always work on either your end or our end. If the connection with a video visit is poor, the visit may have to be switched to a telephone visit. With either a video or telephone visit, we are not always able to ensure that we have a secure connection.  By engaging in this virtual visit, you consent to the provision of healthcare and authorize for your insurance to be billed (if applicable) for the services provided during this visit. Depending on your insurance coverage, you may receive a charge related to this service.  I need to obtain your verbal consent now. Are you willing to proceed with your visit today? JAMISON YUHASZ has provided verbal consent on 02/25/2023 for a virtual visit (video or telephone). [name], [place]  Date: 02/25/2023 9:33 AM  Virtual Visit via Video Note   I, [name], connected with  [name]  ([id number], 11-02-1965) on 02/25/23 at  9:30 AM EST by a video-enabled telemedicine application and verified that I am speaking with the correct person using two identifiers.  Location: Patient: Virtual Visit  Location Patient: Home Provider: Virtual Visit Location Provider: Home Office   I discussed the limitations of evaluation and management by telemedicine and the availability of in person appointments. The patient expressed understanding and agreed to proceed.    History of Present Illness: GERMAINE Morales is a 58 y.o. who identifies as a female who was assigned female at birth, and is being seen today for several days of URI symptoms starting as a sore throat with nasal congestion and sinus pressure. Now with constant and sharp R sided ear pain. Today with R sided facial pain/tooth pain.  Unsure of fever. Denies chills. Some nausea without vomiting.   Took home COVID test which was negative.    HPI: HPI  Problems:  Patient Active Problem List   Diagnosis Date Noted   Hypothyroidism 12/16/2022   Seasonal allergic rhinitis due to pollen 12/16/2022   Benign paroxysmal positional vertigo 12/16/2022   Hyperlipidemia 04/09/2022   Obstructive sleep apnea 01/01/2022   Atypical chest pain 11/12/2021   Palpitations 11/12/2021   Dyspnea on exertion 11/12/2021    Allergies:  Allergies  Allergen Reactions   Hydrocodone-Acetaminophen Nausea Only   Septra [Sulfamethoxazole-Trimethoprim] Hives   Oxycodone Nausea Only   Sulfamethoxazole Rash   Medications:  Current Outpatient Medications:    doxycycline (VIBRA-TABS) 100 MG tablet, Take 1 tablet (100 mg total) by mouth 2 (two) times daily., Disp: 20 tablet, Rfl: 0   ipratropium (ATROVENT) 0.03 % nasal spray, Place 2 sprays into both nostrils every 12 (twelve) hours.,  Disp: 30 mL, Rfl: 0   atorvastatin (LIPITOR) 20 MG tablet, TAKE 1 TABLET BY MOUTH EVERY DAY, Disp: 90 tablet, Rfl: 3   Calcium Carbonate (CALCIUM 500 PO), Take 500 mg by mouth., Disp: , Rfl:    cetirizine (ZYRTEC) 10 MG tablet, Take 10 mg by mouth daily., Disp: , Rfl:    Cholecalciferol (VITAMIN D-1000 MAX ST) 25 MCG (1000 UT) tablet, Take by mouth., Disp: , Rfl:    fish  oil-omega-3 fatty acids 1000 MG capsule, Take 2 g by mouth daily., Disp: , Rfl:    fluticasone (FLONASE) 50 MCG/ACT nasal spray, SPRAY 2 SPRAYS INTO EACH NOSTRIL EVERY DAY, Disp: 16 mL, Rfl: 0   levothyroxine (SYNTHROID) 100 MCG tablet, Take 1 tablet (100 mcg total) by mouth daily., Disp: 90 tablet, Rfl: 1   metoprolol succinate (TOPROL XL) 25 MG 24 hr tablet, Take 0.5 tablets (12.5 mg total) by mouth daily., Disp: 45 tablet, Rfl: 3   Multiple Vitamin (MULTI-VITAMIN) tablet, Take 1 tablet by mouth daily., Disp: , Rfl:    YUVAFEM 10 MCG TABS vaginal tablet, PLACE 1 TABLET (10 MCG TOTAL) VAGINALLY AT BEDTIME. NIGHTLY FOR 2 WEEKS THEN 2 NIGHTS A WEEK, Disp: 16 tablet, Rfl: 12  Observations/Objective: Patient is well-developed, well-nourished in no acute distress.  Resting comfortably at home.  Head is normocephalic, atraumatic.  No labored breathing. Speech is clear and coherent with logical content.  Patient is alert and oriented at baseline.   Assessment and Plan: 1. Acute non-recurrent maxillary sinusitis (Primary) - doxycycline (VIBRA-TABS) 100 MG tablet; Take 1 tablet (100 mg total) by mouth 2 (two) times daily.  Dispense: 20 tablet; Refill: 0 - ipratropium (ATROVENT) 0.03 % nasal spray; Place 2 sprays into both nostrils every 12 (twelve) hours.  Dispense: 30 mL; Refill: 0  2. Acute otalgia, right - ipratropium (ATROVENT) 0.03 % nasal spray; Place 2 sprays into both nostrils every 12 (twelve) hours.  Dispense: 30 mL; Refill: 0  Rx Doxycycline.  Increase fluids.  Rest.  Saline nasal spray.  Probiotic.  Mucinex as directed.  Humidifier in bedroom. Atrovent nasal spray per orders.  Call or return to clinic if symptoms are not improving.   Follow Up Instructions: I discussed the assessment and treatment plan with the patient. The patient was provided an opportunity to ask questions and all were answered. The patient agreed with the plan and demonstrated an understanding of the instructions.   A copy of instructions were sent to the patient via MyChart unless otherwise noted below.   The patient was advised to call back or seek an in-person evaluation if the symptoms worsen or if the condition fails to improve as anticipated.    [name], PA-C

## 2023-02-25 NOTE — Patient Instructions (Signed)
Soyla Murphy, thank you for joining [name], PA-C for today's virtual visit.  While this provider is not your primary care provider (PCP), if your PCP is located in our provider database this encounter information will be shared with them immediately following your visit.   A [facility] MyChart account gives you access to today's visit and all your visits, tests, and labs performed at [facility] " click here if you don't have a [facility] MyChart account or go to [email or web address]  Consent: (Patient) Soyla Murphy provided verbal consent for this virtual visit at the beginning of the encounter.  Current Medications:  Current Outpatient Medications:    atorvastatin (LIPITOR) 20 MG tablet, TAKE 1 TABLET BY MOUTH EVERY DAY, Disp: 90 tablet, Rfl: 3   Calcium Carbonate (CALCIUM 500 PO), Take 500 mg by mouth., Disp: , Rfl:    cetirizine (ZYRTEC) 10 MG tablet, Take 10 mg by mouth daily., Disp: , Rfl:    Cholecalciferol (VITAMIN D-1000 MAX ST) 25 MCG (1000 UT) tablet, Take by mouth., Disp: , Rfl:    fish oil-omega-3 fatty acids 1000 MG capsule, Take 2 g by mouth daily., Disp: , Rfl:    fluticasone (FLONASE) 50 MCG/ACT nasal spray, SPRAY 2 SPRAYS INTO EACH NOSTRIL EVERY DAY, Disp: 16 mL, Rfl: 0   levothyroxine (SYNTHROID) 100 MCG tablet, Take 1 tablet (100 mcg total) by mouth daily., Disp: 90 tablet, Rfl: 1   metoprolol succinate (TOPROL XL) 25 MG 24 hr tablet, Take 0.5 tablets (12.5 mg total) by mouth daily., Disp: 45 tablet, Rfl: 3   Multiple Vitamin (MULTI-VITAMIN) tablet, Take 1 tablet by mouth daily., Disp: , Rfl:    YUVAFEM 10 MCG TABS vaginal tablet, PLACE 1 TABLET (10 MCG TOTAL) VAGINALLY AT BEDTIME. NIGHTLY FOR 2 WEEKS THEN 2 NIGHTS A WEEK, Disp: 16 tablet, Rfl: 12   Medications ordered in this encounter:  No orders of the defined types were placed in this encounter.    *If you need refills on other medications prior to your next appointment, please  contact your pharmacy*  Follow-Up: Call back or seek an in-person evaluation if the symptoms worsen or if the condition fails to improve as anticipated.  [facility] [phone]  Other Instructions Please take antibiotic as directed.  Increase fluid intake.  Use Saline nasal spray.  Take a daily multivitamin. Use the Atrovent nasal spray as directed.  Place a humidifier in the bedroom.  Please call or return clinic if symptoms are not improving.  Sinusitis Sinusitis is redness, soreness, and swelling (inflammation) of the paranasal sinuses. Paranasal sinuses are air pockets within the bones of your face (beneath the eyes, the middle of the forehead, or above the eyes). In healthy paranasal sinuses, mucus is able to drain out, and air is able to circulate through them by way of your nose. However, when your paranasal sinuses are inflamed, mucus and air can become trapped. This can allow bacteria and other germs to grow and cause infection. Sinusitis can develop quickly and last only a short time (acute) or continue over a long period (chronic). Sinusitis that lasts for more than 12 weeks is considered chronic.  CAUSES  Causes of sinusitis include: Allergies. Structural abnormalities, such as displacement of the cartilage that separates your nostrils (deviated septum), which can decrease the air flow through your nose and sinuses and affect sinus drainage. Functional abnormalities, such as when the small hairs (cilia) that line your sinuses and help remove mucus  do not work properly or are not present. SYMPTOMS  Symptoms of acute and chronic sinusitis are the same. The primary symptoms are pain and pressure around the affected sinuses. Other symptoms include: Upper toothache. Earache. Headache. Bad breath. Decreased sense of smell and taste. A cough, which worsens when you are lying flat. Fatigue. Fever. Thick drainage from your nose, which often is green and may contain pus  (purulent). Swelling and warmth over the affected sinuses. DIAGNOSIS  Your caregiver will perform a physical exam. During the exam, your caregiver may: Look in your nose for signs of abnormal growths in your nostrils (nasal polyps). Tap over the affected sinus to check for signs of infection. View the inside of your sinuses (endoscopy) with a special imaging device with a light attached (endoscope), which is inserted into your sinuses. If your caregiver suspects that you have chronic sinusitis, one or more of the following tests may be recommended: Allergy tests. Nasal culture A sample of mucus is taken from your nose and sent to a lab and screened for bacteria. Nasal cytology A sample of mucus is taken from your nose and examined by your caregiver to determine if your sinusitis is related to an allergy. TREATMENT  Most cases of acute sinusitis are related to a viral infection and will resolve on their own within 10 days. Sometimes medicines are prescribed to help relieve symptoms (pain medicine, decongestants, nasal steroid sprays, or saline sprays).  However, for sinusitis related to a bacterial infection, your caregiver will prescribe antibiotic medicines. These are medicines that will help kill the bacteria causing the infection.  Rarely, sinusitis is caused by a fungal infection. In theses cases, your caregiver will prescribe antifungal medicine. For some cases of chronic sinusitis, surgery is needed. Generally, these are cases in which sinusitis recurs more than 3 times per year, despite other treatments. HOME CARE INSTRUCTIONS  Drink plenty of water. Water helps thin the mucus so your sinuses can drain more easily. Use a humidifier. Inhale steam 3 to 4 times a day (for example, sit in the bathroom with the shower running). Apply a warm, moist washcloth to your face 3 to 4 times a day, or as directed by your caregiver. Use saline nasal sprays to help moisten and clean your sinuses. Take  over-the-counter or prescription medicines for pain, discomfort, or fever only as directed by your caregiver. SEEK IMMEDIATE MEDICAL CARE IF: You have increasing pain or severe headaches. You have nausea, vomiting, or drowsiness. You have swelling around your face. You have vision problems. You have a stiff neck. You have difficulty breathing. MAKE SURE YOU:  Understand these instructions. Will watch your condition. Will get help right away if you are not doing well or get worse. Document Released: 01/13/2005 Document Revised: 04/07/2011 Document Reviewed: 01/28/2011 [facility] Patient Information 2014 [place], [place].    If you have been instructed to have an in-person evaluation today at a local Urgent Care facility, please use the link below. It will take you to a list of all of our available [facility], including address, phone number and hours of operation. Please do not delay care.  [facility]  If you or a family member do not have a primary care provider, use the link below to schedule a visit and establish care. When you choose a [facility] primary care physician or advanced practice provider, you gain a long-term partner in health. Find a Primary Care Provider  Learn more about [facility] in-office  and virtual care options: [facility] - Get Care Now

## 2023-03-23 ENCOUNTER — Encounter: Payer: Self-pay | Admitting: Family Medicine

## 2023-03-23 ENCOUNTER — Ambulatory Visit: Payer: 59 | Admitting: Family Medicine

## 2023-03-23 VITALS — BP 120/84 | HR 74 | Temp 98.0°F | Ht 67.0 in | Wt 152.8 lb

## 2023-03-23 DIAGNOSIS — E7800 Pure hypercholesterolemia, unspecified: Secondary | ICD-10-CM | POA: Diagnosis not present

## 2023-03-23 DIAGNOSIS — E039 Hypothyroidism, unspecified: Secondary | ICD-10-CM | POA: Diagnosis not present

## 2023-03-23 DIAGNOSIS — Z131 Encounter for screening for diabetes mellitus: Secondary | ICD-10-CM | POA: Diagnosis not present

## 2023-03-23 DIAGNOSIS — Z0000 Encounter for general adult medical examination without abnormal findings: Secondary | ICD-10-CM | POA: Diagnosis not present

## 2023-03-23 LAB — CBC WITH DIFFERENTIAL/PLATELET
Basophils Absolute: 0 10*3/uL (ref 0.0–0.1)
Basophils Relative: 1.2 % (ref 0.0–3.0)
Eosinophils Absolute: 0.1 10*3/uL (ref 0.0–0.7)
Eosinophils Relative: 3.8 % (ref 0.0–5.0)
HCT: 42.3 % (ref 36.0–46.0)
Hemoglobin: 14.1 g/dL (ref 12.0–15.0)
Lymphocytes Relative: 36.8 % (ref 12.0–46.0)
Lymphs Abs: 1.3 10*3/uL (ref 0.7–4.0)
MCHC: 33.3 g/dL (ref 30.0–36.0)
MCV: 90.6 fl (ref 78.0–100.0)
Monocytes Absolute: 0.3 10*3/uL (ref 0.1–1.0)
Monocytes Relative: 7.9 % (ref 3.0–12.0)
Neutro Abs: 1.8 10*3/uL (ref 1.4–7.7)
Neutrophils Relative %: 50.3 % (ref 43.0–77.0)
Platelets: 220 10*3/uL (ref 150.0–400.0)
RBC: 4.66 Mil/uL (ref 3.87–5.11)
RDW: 13.7 % (ref 11.5–15.5)
WBC: 3.5 10*3/uL — ABNORMAL LOW (ref 4.0–10.5)

## 2023-03-23 LAB — LIPID PANEL
Cholesterol: 132 mg/dL (ref 0–200)
HDL: 66 mg/dL (ref >39.00)
LDL Cholesterol: 54 mg/dL (ref 0–99)
NonHDL: 65.85
Total CHOL/HDL Ratio: 2
Triglycerides: 61 mg/dL (ref 0.0–149.0)
VLDL: 12.2 mg/dL (ref 0.0–40.0)

## 2023-03-23 LAB — COMPREHENSIVE METABOLIC PANEL
ALT: 26 U/L (ref 0–35)
AST: 19 U/L (ref 0–37)
Albumin: 4.4 g/dL (ref 3.5–5.2)
Alkaline Phosphatase: 64 U/L (ref 39–117)
BUN: 14 mg/dL (ref 6–23)
CO2: 30 meq/L (ref 19–32)
Calcium: 9 mg/dL (ref 8.4–10.5)
Chloride: 104 meq/L (ref 96–112)
Creatinine, Ser: 0.71 mg/dL (ref 0.40–1.20)
GFR: 93.22 mL/min (ref >60.00)
Glucose, Bld: 83 mg/dL (ref 70–99)
Potassium: 4.1 meq/L (ref 3.5–5.1)
Sodium: 141 meq/L (ref 135–145)
Total Bilirubin: 0.6 mg/dL (ref 0.2–1.2)
Total Protein: 6.9 g/dL (ref 6.0–8.3)

## 2023-03-23 LAB — URINALYSIS, ROUTINE W REFLEX MICROSCOPIC
Bilirubin Urine: NEGATIVE
Hgb urine dipstick: NEGATIVE
Ketones, ur: NEGATIVE
Leukocytes,Ua: NEGATIVE
Nitrite: NEGATIVE
RBC / HPF: NONE SEEN (ref 0–?)
Specific Gravity, Urine: 1.015 (ref 1.000–1.030)
Total Protein, Urine: NEGATIVE
Urine Glucose: NEGATIVE
Urobilinogen, UA: 0.2 (ref 0.0–1.0)
pH: 6.5 (ref 5.0–8.0)

## 2023-03-23 LAB — HEMOGLOBIN A1C: Hgb A1c MFr Bld: 5.8 % (ref 4.6–6.5)

## 2023-03-23 LAB — TSH: TSH: 0.21 u[IU]/mL — ABNORMAL LOW (ref 0.35–5.50)

## 2023-03-23 MED ORDER — LEVOTHYROXINE SODIUM 88 MCG PO TABS: 88.0000 ug | ORAL_TABLET | Freq: Every day | ORAL | 3 refills | Status: DC

## 2023-03-23 NOTE — Progress Notes (Addendum)
 Established Patient Office Visit   Subjective:  Patient ID: Melinda Morales, female    DOB: 1965-11-10  Age: 58 y.o. MRN: [id number]  Chief Complaint  Patient presents with   Medical Management of Chronic Issues    3 month follow up. Pt is fasting. TSH.  No concerns.     HPI Encounter Diagnoses  Name Primary?   Healthcare maintenance Yes   Screening for diabetes mellitus    Elevated LDL cholesterol level    Hypothyroidism, unspecified type    Doing okay.  Mom is now in assisted living.  Has been able to lose 30 pounds with weight watchers over the last several months.  Regular dental care.  Up-to-date on GYN checks.  Doing okay with the lower dose of levothyroxine 100 mcg.  He is atorvastatin.   Review of Systems  Constitutional: Negative.   HENT: Negative.    Eyes:  Negative for blurred vision, discharge and redness.  Respiratory: Negative.    Cardiovascular: Negative.   Gastrointestinal:  Negative for abdominal pain.  Genitourinary: Negative.   Musculoskeletal: Negative.  Negative for myalgias.  Skin:  Negative for rash.  Neurological:  Negative for tingling, loss of consciousness and weakness.  Endo/Heme/Allergies:  Negative for polydipsia.     Current Outpatient Medications:    atorvastatin (LIPITOR) 20 MG tablet, TAKE 1 TABLET BY MOUTH EVERY DAY, Disp: 90 tablet, Rfl: 3   Calcium Carbonate (CALCIUM 500 PO), Take 500 mg by mouth., Disp: , Rfl:    cetirizine (ZYRTEC) 10 MG tablet, Take 10 mg by mouth daily., Disp: , Rfl:    Cholecalciferol (VITAMIN D-1000 MAX ST) 25 MCG (1000 UT) tablet, Take by mouth., Disp: , Rfl:    fish oil-omega-3 fatty acids 1000 MG capsule, Take 2 g by mouth daily., Disp: , Rfl:    fluticasone (FLONASE) 50 MCG/ACT nasal spray, SPRAY 2 SPRAYS INTO EACH NOSTRIL EVERY DAY, Disp: 16 mL, Rfl: 0   ipratropium (ATROVENT) 0.03 % nasal spray, Place 2 sprays into both nostrils every 12 (twelve) hours., Disp: 30 mL, Rfl: 0   levothyroxine (SYNTHROID) 88  MCG tablet, Take 1 tablet (88 mcg total) by mouth daily., Disp: 90 tablet, Rfl: 3   metoprolol succinate (TOPROL XL) 25 MG 24 hr tablet, Take 0.5 tablets (12.5 mg total) by mouth daily., Disp: 45 tablet, Rfl: 3   Multiple Vitamin (MULTI-VITAMIN) tablet, Take 1 tablet by mouth daily., Disp: , Rfl:    YUVAFEM 10 MCG TABS vaginal tablet, PLACE 1 TABLET (10 MCG TOTAL) VAGINALLY AT BEDTIME. NIGHTLY FOR 2 WEEKS THEN 2 NIGHTS A WEEK, Disp: 16 tablet, Rfl: 12   doxycycline (VIBRA-TABS) 100 MG tablet, Take 1 tablet (100 mg total) by mouth 2 (two) times daily. (Patient not taking: Reported on 03/23/2023), Disp: 20 tablet, Rfl: 0   Objective:     BP 120/84   Pulse 74   Temp 98 F (36.7 C)   Ht 5\' 7"  (1.702 m)   Wt 152 lb 12.8 oz (69.3 kg)   SpO2 98%   BMI 23.93 kg/m    Physical Exam Constitutional:      General: She is not in acute distress.    Appearance: Normal appearance. She is not ill-appearing, toxic-appearing or diaphoretic.  HENT:     Head: Normocephalic and atraumatic.     Right Ear: Tympanic membrane, ear canal and external ear normal.     Left Ear: Tympanic membrane, ear canal and external ear normal.     Mouth/Throat:  Mouth: Mucous membranes are moist.     Pharynx: Oropharynx is clear. No oropharyngeal exudate or posterior oropharyngeal erythema.  Eyes:     General: No scleral icterus.       Right eye: No discharge.        Left eye: No discharge.     Extraocular Movements: Extraocular movements intact.     Conjunctiva/sclera: Conjunctivae normal.     Pupils: Pupils are equal, round, and reactive to light.  Cardiovascular:     Rate and Rhythm: Normal rate and regular rhythm.  Pulmonary:     Effort: Pulmonary effort is normal. No respiratory distress.     Breath sounds: Normal breath sounds.  Abdominal:     General: Bowel sounds are normal.  Musculoskeletal:     Cervical back: No rigidity or tenderness.  Lymphadenopathy:     Cervical: No cervical adenopathy.   Skin:    General: Skin is warm and dry.  Neurological:     Mental Status: She is alert and oriented to person, place, and time.  Psychiatric:        Mood and Affect: Mood normal.        Behavior: Behavior normal.      Results for orders placed or performed in visit on 03/23/23  Comprehensive metabolic panel  Result Value Ref Range   Sodium 141 135 - 145 mEq/L   Potassium 4.1 3.5 - 5.1 mEq/L   Chloride 104 96 - 112 mEq/L   CO2 30 19 - 32 mEq/L   Glucose, Bld 83 70 - 99 mg/dL   BUN 14 6 - 23 mg/dL   Creatinine, Ser 4.09 0.40 - 1.20 mg/dL   Total Bilirubin 0.6 0.2 - 1.2 mg/dL   Alkaline Phosphatase 64 39 - 117 U/L   AST 19 0 - 37 U/L   ALT 26 0 - 35 U/L   Total Protein 6.9 6.0 - 8.3 g/dL   Albumin 4.4 3.5 - 5.2 g/dL   GFR 81.19 >14.78 mL/min   Calcium 9.0 8.4 - 10.5 mg/dL  TSH  Result Value Ref Range   TSH 0.21 (L) 0.35 - 5.50 uIU/mL  CBC with Differential/Platelet  Result Value Ref Range   WBC 3.5 (L) 4.0 - 10.5 K/uL   RBC 4.66 3.87 - 5.11 Mil/uL   Hemoglobin 14.1 12.0 - 15.0 g/dL   HCT 29.5 62.1 - 30.8 %   MCV 90.6 78.0 - 100.0 fl   MCHC 33.3 30.0 - 36.0 g/dL   RDW 65.7 84.6 - 96.2 %   Platelets 220.0 150.0 - 400.0 K/uL   Neutrophils Relative % 50.3 43.0 - 77.0 %   Lymphocytes Relative 36.8 12.0 - 46.0 %   Monocytes Relative 7.9 3.0 - 12.0 %   Eosinophils Relative 3.8 0.0 - 5.0 %   Basophils Relative 1.2 0.0 - 3.0 %   Neutro Abs 1.8 1.4 - 7.7 K/uL   Lymphs Abs 1.3 0.7 - 4.0 K/uL   Monocytes Absolute 0.3 0.1 - 1.0 K/uL   Eosinophils Absolute 0.1 0.0 - 0.7 K/uL   Basophils Absolute 0.0 0.0 - 0.1 K/uL  Hemoglobin A1c  Result Value Ref Range   Hgb A1c MFr Bld 5.8 4.6 - 6.5 %  Lipid panel  Result Value Ref Range   Cholesterol 132 0 - 200 mg/dL   Triglycerides 95.2 0.0 - 149.0 mg/dL   HDL 84.13 >24.40 mg/dL   VLDL 10.2 0.0 - 72.5 mg/dL   LDL Cholesterol 54 0 - 99 mg/dL  Total CHOL/HDL Ratio 2    NonHDL 65.85   Urinalysis, Routine w reflex microscopic   Result Value Ref Range   Color, Urine YELLOW Yellow;Lt. Yellow;Straw;Dark Yellow;Amber;Green;Red;Brown   APPearance CLEAR Clear;Turbid;Slightly Cloudy;Cloudy   Specific Gravity, Urine 1.015 1.000 - 1.030   pH 6.5 5.0 - 8.0   Total Protein, Urine NEGATIVE Negative   Urine Glucose NEGATIVE Negative   Ketones, ur NEGATIVE Negative   Bilirubin Urine NEGATIVE Negative   Hgb urine dipstick NEGATIVE Negative   Urobilinogen, UA 0.2 0.0 - 1.0   Leukocytes,Ua NEGATIVE Negative   Nitrite NEGATIVE Negative   WBC, UA 0-2/hpf 0-2/hpf   RBC / HPF none seen 0-2/hpf   Squamous Epithelial / HPF Rare(0-4/hpf) Rare(0-4/hpf)  HM PAP SMEAR  Result Value Ref Range   HM Pap smear Negative       The 10-year ASCVD risk score (Arnett DK, et al., 2019) is: 1.6%    Assessment & Plan:   Healthcare maintenance -     CBC with Differential/Platelet -     Urinalysis, Routine w reflex microscopic  Screening for diabetes mellitus -     Comprehensive metabolic panel -     Hemoglobin A1c  Elevated LDL cholesterol level -     Comprehensive metabolic panel -     Lipid panel  Hypothyroidism, unspecified type -     TSH -     Levothyroxine Sodium; Take 1 tablet (88 mcg total) by mouth daily.  Dispense: 90 tablet; Refill: 3 -     TSH; Future    Return in about 1 year (around 03/22/2024), or if symptoms worsen or fail to improve.  Levothyroxine adjusted pending results of TSH today.  Continue atorvastatin.  Information was given on health maintenance and disease per   Mliss Sax, MD  2/14 addendum: Have decreased levothyroxine to 88 mcg daily.  Asked patient to return in 2 months for a TSH check labs only.

## 2023-03-23 NOTE — Addendum Note (Signed)
 Addended by: Andrez Grime on: 03/23/2023 04:57 PM   Modules accepted: Orders

## 2023-03-24 ENCOUNTER — Other Ambulatory Visit: Payer: Self-pay | Admitting: Family Medicine

## 2023-03-24 DIAGNOSIS — J301 Allergic rhinitis due to pollen: Secondary | ICD-10-CM

## 2023-03-27 ENCOUNTER — Other Ambulatory Visit: Payer: Self-pay | Admitting: Cardiovascular Disease

## 2023-04-04 ENCOUNTER — Other Ambulatory Visit: Payer: Self-pay | Admitting: Family Medicine

## 2023-04-04 DIAGNOSIS — E039 Hypothyroidism, unspecified: Secondary | ICD-10-CM

## 2023-04-24 ENCOUNTER — Telehealth: Admitting: Physician Assistant

## 2023-04-24 DIAGNOSIS — J329 Chronic sinusitis, unspecified: Secondary | ICD-10-CM

## 2023-04-24 DIAGNOSIS — R112 Nausea with vomiting, unspecified: Secondary | ICD-10-CM | POA: Diagnosis not present

## 2023-04-24 MED ORDER — ONDANSETRON 4 MG PO TBDP: 4.0000 mg | ORAL_TABLET | Freq: Three times a day (TID) | ORAL | 0 refills | Status: AC | PRN

## 2023-04-24 MED ORDER — AMOXICILLIN-POT CLAVULANATE 875-125 MG PO TABS: 1.0000 | ORAL_TABLET | Freq: Two times a day (BID) | ORAL | 0 refills | Status: AC

## 2023-04-24 NOTE — Progress Notes (Signed)
 [name], Morales are scheduled for a virtual visit with your provider today.    Just as we do with appointments in the office, we must obtain your consent to participate.  Your consent will be active for this visit and any virtual visit you may have with one of our providers in the next 365 days.    If you have a MyChart account, I can also send a copy of this consent to you electronically.  All virtual visits are billed to your insurance company just like a traditional visit in the office.  As this is a virtual visit, video technology does not allow for your provider to perform a traditional examination.  This may limit your provider's ability to fully assess your condition.  If your provider identifies any concerns that need to be evaluated in person or the need to arrange testing such as labs, EKG, etc, we will make arrangements to do so.    Although advances in technology are sophisticated, we cannot ensure that it will always work on either your end or our end.  If the connection with a video visit is poor, we may have to switch to a telephone visit.  With either a video or telephone visit, we are not always able to ensure that we have a secure connection.   I need to obtain your verbal consent now.   Are you willing to proceed with your visit today?   Melinda Morales has provided verbal consent on 04/24/2023 for a virtual visit (video or telephone).   [name], [place] 04/24/2023  9:38 AM   Date:  04/24/2023   ID:  Melinda Morales, Melinda Morales [birth year], MRN [id number]  Patient Location: Home Provider Location: Home Office   Participants: Patient and Provider for Visit and Wrap up  Method of visit: Video  Location of Patient: Home Location of Provider: Home Office Consent was obtain for visit over the video. Services rendered by provider: Visit was performed via video  A video enabled telemedicine application was used and I verified that I am speaking with the correct person using two  identifiers.  PCP:  Mliss Sax, MD   Chief Complaint:  sinus problem  History of Present Illness:    Melinda Morales is a 58 y.o. female with history as stated below. Presents video telehealth for an acute care visit  Pt reports a h/o seasonal allergies and has had rhinorrhea/congestion which has been ongoing. Two days ago symptoms worsened and she started to experienced sinus pressure to her forehead and pain to her teeth. She reports a headache, cough, nausea and vomiting that started yesterday. She reports subjective fevers at home.   She denies any severe abdominal pain. Denies any uti sxs, diarrhea.   Past Medical, Surgical, Social History, Allergies, and Medications have been Reviewed.  Past Medical History:  Diagnosis Date   Hyperlipidemia    Hypertension    Tachycardia 10/2021   Thyroid disease     No outpatient medications have been marked as taking for the 04/24/23 encounter (Video Visit) with [facility] PROVIDER.     Allergies:   Hydrocodone-acetaminophen, Septra [sulfamethoxazole-trimethoprim], Oxycodone, and Sulfamethoxazole   ROS See HPI for history of present illness.  Physical Exam Constitutional:      General: She is not in acute distress.    Appearance: Normal appearance. She is not ill-appearing.  HENT:     Nose: Congestion present.  Neurological:     Mental Status: She is alert.  MDM: Pt with sxs consistent with sinusitis. Will treat with augmentin. Also with nv likely related to persistent postnasal drip. Doubt emergent intraabdominal pelvic pathology. Rx for zofran given.   Disposition:  Follow up  Signed, [name], PA-C  04/24/2023 9:38 AM

## 2023-04-24 NOTE — Patient Instructions (Signed)
 Melinda Morales, thank you for joining [name], PA-C for today's virtual visit.  While this provider is not your primary care provider (PCP), if your PCP is located in our provider database this encounter information will be shared with them immediately following your visit.   A [facility] MyChart account gives you access to today's visit and all your visits, tests, and labs performed at [facility] " click here if you don't have a [facility] MyChart account or go to [email or web address]  Consent: (Patient) Melinda Morales provided verbal consent for this virtual visit at the beginning of the encounter.  Current Medications:  Current Outpatient Medications:    atorvastatin (LIPITOR) 20 MG tablet, TAKE 1 TABLET BY MOUTH EVERY DAY, Disp: 90 tablet, Rfl: 3   Calcium Carbonate (CALCIUM 500 PO), Take 500 mg by mouth., Disp: , Rfl:    cetirizine (ZYRTEC) 10 MG tablet, Take 10 mg by mouth daily., Disp: , Rfl:    Cholecalciferol (VITAMIN D-1000 MAX ST) 25 MCG (1000 UT) tablet, Take by mouth., Disp: , Rfl:    doxycycline (VIBRA-TABS) 100 MG tablet, Take 1 tablet (100 mg total) by mouth 2 (two) times daily. (Patient not taking: Reported on 03/23/2023), Disp: 20 tablet, Rfl: 0   fish oil-omega-3 fatty acids 1000 MG capsule, Take 2 g by mouth daily., Disp: , Rfl:    fluticasone (FLONASE) 50 MCG/ACT nasal spray, SPRAY 2 SPRAYS INTO EACH NOSTRIL EVERY DAY, Disp: 16 mL, Rfl: 0   ipratropium (ATROVENT) 0.03 % nasal spray, Place 2 sprays into both nostrils every 12 (twelve) hours., Disp: 30 mL, Rfl: 0   levothyroxine (SYNTHROID) 88 MCG tablet, Take 1 tablet (88 mcg total) by mouth daily., Disp: 90 tablet, Rfl: 3   metoprolol succinate (TOPROL-XL) 25 MG 24 hr tablet, TAKE 1/2 TABLET BY MOUTH EVERY DAY, Disp: 45 tablet, Rfl: 2   Multiple Vitamin (MULTI-VITAMIN) tablet, Take 1 tablet by mouth daily., Disp: , Rfl:    YUVAFEM 10 MCG TABS vaginal tablet, PLACE 1 TABLET (10 MCG TOTAL)  VAGINALLY AT BEDTIME. NIGHTLY FOR 2 WEEKS THEN 2 NIGHTS A WEEK, Disp: 16 tablet, Rfl: 12   Medications ordered in this encounter:  No orders of the defined types were placed in this encounter.    *If you need refills on other medications prior to your next appointment, please contact your pharmacy*  Follow-Up: Call back or seek an in-person evaluation if the symptoms worsen or if the condition fails to improve as anticipated.  [facility] [phone]  Other Instructions You were given a prescription for antibiotics. Please take the antibiotic prescription fully.   You were given a prescription for zofran to help with your nausea. Please take as directed.  Follow up with your regular doctor in 1 week for reassessment and seek care sooner if your symptoms worsen or fail to improve.    If you have been instructed to have an in-person evaluation today at a local Urgent Care facility, please use the link below. It will take you to a list of all of our available [facility], including address, phone number and hours of operation. Please do not delay care.  [facility]  If you or a family member do not have a primary care provider, use the link below to schedule a visit and establish care. When you choose a [facility] primary care physician or advanced practice provider, you gain a long-term partner in health. Find a  Primary Care Provider  Learn more about [facility] in-office and virtual care options: [facility] - Get Care Now

## 2023-05-01 ENCOUNTER — Other Ambulatory Visit: Payer: Self-pay | Admitting: Family Medicine

## 2023-05-01 DIAGNOSIS — J301 Allergic rhinitis due to pollen: Secondary | ICD-10-CM

## 2023-06-14 ENCOUNTER — Other Ambulatory Visit: Payer: Self-pay | Admitting: Family Medicine

## 2023-06-14 DIAGNOSIS — E039 Hypothyroidism, unspecified: Secondary | ICD-10-CM

## 2023-09-25 ENCOUNTER — Other Ambulatory Visit (HOSPITAL_BASED_OUTPATIENT_CLINIC_OR_DEPARTMENT_OTHER): Payer: Self-pay | Admitting: Obstetrics and Gynecology

## 2023-09-25 DIAGNOSIS — Z139 Encounter for screening, unspecified: Secondary | ICD-10-CM

## 2023-10-01 ENCOUNTER — Ambulatory Visit (HOSPITAL_BASED_OUTPATIENT_CLINIC_OR_DEPARTMENT_OTHER)
Admission: RE | Admit: 2023-10-01 | Discharge: 2023-10-01 | Disposition: A | Discharge status: Home / Self Care | Source: Ambulatory Visit | Attending: Obstetrics and Gynecology | Admitting: Obstetrics and Gynecology

## 2023-10-01 ENCOUNTER — Encounter (HOSPITAL_BASED_OUTPATIENT_CLINIC_OR_DEPARTMENT_OTHER): Payer: Self-pay

## 2023-10-01 DIAGNOSIS — Z139 Encounter for screening, unspecified: Secondary | ICD-10-CM

## 2023-10-01 DIAGNOSIS — Z1231 Encounter for screening mammogram for malignant neoplasm of breast: Secondary | ICD-10-CM | POA: Diagnosis present

## 2023-10-07 ENCOUNTER — Ambulatory Visit: Payer: Self-pay | Admitting: Obstetrics and Gynecology

## 2023-10-28 ENCOUNTER — Other Ambulatory Visit: Payer: Self-pay | Admitting: Cardiovascular Disease

## 2023-12-07 ENCOUNTER — Ambulatory Visit: Admitting: Cardiovascular Disease

## 2023-12-08 ENCOUNTER — Ambulatory Visit: Attending: Cardiovascular Disease | Admitting: Cardiovascular Disease

## 2023-12-08 ENCOUNTER — Encounter: Payer: Self-pay | Admitting: Cardiovascular Disease

## 2023-12-08 VITALS — BP 126/81 | HR 64 | Resp 17 | Ht 67.0 in | Wt 161.0 lb

## 2023-12-08 DIAGNOSIS — E782 Mixed hyperlipidemia: Secondary | ICD-10-CM

## 2023-12-08 DIAGNOSIS — R002 Palpitations: Secondary | ICD-10-CM | POA: Diagnosis not present

## 2023-12-08 NOTE — Progress Notes (Signed)
 12/08/2023 Melinda Morales   1965/11/23  [id number]  Primary Physician Berneta Elsie Sayre, MD Primary Cardiologist: Dorn JINNY Lesches MD FACP, [place], [place], FSCAI  HPI:  Melinda Morales is a 58 y.o.   mildly overweight married Caucasian female with no children who was referred by the ER for chest pain after recent evaluation in 12/03/2022.  She is a product manager.  I last saw her in the office 04/09/2022.  She has no risk factors for ischemic heart disease.  Her mother did have a myocardial infarction however this was at age 83.  She is never had heart attack or stroke.  She did break her ankle in August and wore a boot for a while.  Over the last several weeks she has been awakened from sleep with palpitations as described atypical left-sided chest pain with dyspnea on exertion.  She was seen in the ER 11/06/2021 with the symptoms and had a negative work-up at that time.   I obtained a coronary CTA on her 12/06/2021 which revealed a coronary calcium  score of 0 without obstructive disease.  A Zio patch revealed occasional PACs, PVCs and short runs of SVT.  2D echo was normal.  Since I saw her she has lost 17 pounds on weight watchers and reduced her caffeine intake significantly.  Her chest pain has significantly improved as have her palpitations.   Since I saw her a year ago she has retired from being a tour manager.  She was taking care of her elderly sick mother who is currently in an assisted care facility.  She denies chest pain.  She gets occasional palpitations when lying in bed on her left side.  She continues to follow her weight watcher diet and has remained maintain her weight.  She goes to the gym frequently and does 20 minutes of treadmill and 40 minutes lifting weights.  Current Meds  Medication Sig   atorvastatin  (LIPITOR) 20 MG tablet TAKE 1 TABLET BY MOUTH EVERY DAY   Calcium  Carbonate (CALCIUM  500 PO) Take 500 mg by mouth.   cetirizine (ZYRTEC) 10 MG tablet  Take 10 mg by mouth daily.   Cholecalciferol (VITAMIN D-1000 MAX ST) 25 MCG (1000 UT) tablet Take by mouth.   fish oil-omega-3 fatty acids 1000 MG capsule Take 2 g by mouth daily.   fluticasone  (FLONASE ) 50 MCG/ACT nasal spray SPRAY 2 SPRAYS INTO EACH NOSTRIL EVERY DAY   levothyroxine  (SYNTHROID ) 88 MCG tablet TAKE 1 TABLET BY MOUTH EVERY DAY   metoprolol  succinate (TOPROL -XL) 25 MG 24 hr tablet TAKE 1/2 TABLET BY MOUTH EVERY DAY   Multiple Vitamin (MULTI-VITAMIN) tablet Take 1 tablet by mouth daily.   ondansetron  (ZOFRAN -ODT) 4 MG disintegrating tablet Take 1 tablet (4 mg total) by mouth every 8 (eight) hours as needed for nausea or vomiting.   YUVAFEM  10 MCG TABS vaginal tablet PLACE 1 TABLET (10 MCG TOTAL) VAGINALLY AT BEDTIME. NIGHTLY FOR 2 WEEKS THEN 2 NIGHTS A WEEK     Allergies  Allergen Reactions   Hydrocodone -Acetaminophen  Nausea Only   Septra [Sulfamethoxazole-Trimethoprim] Hives   Oxycodone Nausea Only   Sulfamethoxazole Rash    Social History   Socioeconomic History   Marital status: Married    Spouse name: Not on file   Number of children: 0   Years of education: Not on file   Highest education level: Not on file  Occupational History   Occupation: atternoy  Tobacco Use   Smoking status: Never  Smokeless tobacco: Never  Vaping Use   Vaping status: Never Used  Substance and Sexual Activity   Alcohol use: No   Drug use: No   Sexual activity: Yes    Birth control/protection: None  Other Topics Concern   Not on file  Social History Narrative   Not on file   Social Drivers of Health   Financial Resource Strain: Not on file  Food Insecurity: Not on file  Transportation Needs: Not on file  Physical Activity: Not on file  Stress: Not on file  Social Connections: Not on file  Intimate Partner Violence: Not on file     Review of Systems: General: negative for chills, fever, night sweats or weight changes.  Cardiovascular: negative for chest pain, dyspnea  on exertion, edema, orthopnea, palpitations, paroxysmal nocturnal dyspnea or shortness of breath Dermatological: negative for rash Respiratory: negative for cough or wheezing Urologic: negative for hematuria Abdominal: negative for nausea, vomiting, diarrhea, bright red blood per rectum, melena, or hematemesis Neurologic: negative for visual changes, syncope, or dizziness All other systems reviewed and are otherwise negative except as noted above.    Blood pressure 126/81, pulse 64, resp. rate 17, height 5' 7 (1.702 m), weight 161 lb (73 kg), SpO2 98%.  General appearance: alert and no distress Neck: no adenopathy, no carotid bruit, no JVD, supple, symmetrical, trachea midline, and thyroid  not enlarged, symmetric, no tenderness/mass/nodules Lungs: clear to auscultation bilaterally Heart: regular rate and rhythm, S1, S2 normal, no murmur, click, rub or gallop Extremities: extremities normal, atraumatic, no cyanosis or edema Pulses: 2+ and symmetric Skin: Skin color, texture, turgor normal. No rashes or lesions Neurologic: Grossly normal  EKG EKG Interpretation Date/Time:  Tuesday December 08 2023 09:39:29 EST Ventricular Rate:  72 PR Interval:  172 QRS Duration:  78 QT Interval:  412 QTC Calculation: 451 R Axis:   79  Text Interpretation: Normal sinus rhythm Normal ECG When compared with ECG of 03-Dec-2022 08:43, No significant change was found Confirmed by Court Carrier [phone]) on 12/08/2023 9:58:36 AM    ASSESSMENT AND PLAN:   Palpitations History of palpitations in the past found to be occasional PVC PVCs, PACs and short runs of SVT on Zio patch.  These are worse when she lies in bed on her left side.  They have significantly improved over time probably related to stress and stress reduction.  Hyperlipidemia History of hyperlipidemia on statin therapy with lipid profile performed 03/19/2023 revealing total cholesterol 132, LDL 54 and HDL of 66.     Carrier DOROTHA Court MD  [facility], [facility] 12/08/2023 10:10 AM

## 2023-12-08 NOTE — Assessment & Plan Note (Signed)
 History of palpitations in the past found to be occasional PVC PVCs, PACs and short runs of SVT on Zio patch.  These are worse when she lies in bed on her left side.  They have significantly improved over time probably related to stress and stress reduction.

## 2023-12-08 NOTE — Assessment & Plan Note (Signed)
 History of hyperlipidemia on statin therapy with lipid profile performed 03/19/2023 revealing total cholesterol 132, LDL 54 and HDL of 66.

## 2023-12-08 NOTE — Patient Instructions (Signed)
Medication Instructions:   No changes *If you need a refill on your cardiac medications before your next appointment, please call your pharmacy*   Lab Work: Not  needed    Testing/Procedures:  Not needed  Follow-Up: At [facility], you and your health needs are our priority.  As part of our continuing mission to provide you with exceptional heart care, we have created designated Provider Care Teams.  These Care Teams include your primary Cardiologist (physician) and Advanced Practice Providers (APPs -  Physician Assistants and Nurse Practitioners) who all work together to provide you with the care you need, when you need it.     Your next appointment:   12 month(s)  The format for your next appointment:   In Person  Provider:   Nanetta Batty, MD

## 2023-12-26 ENCOUNTER — Other Ambulatory Visit: Payer: Self-pay | Admitting: Cardiovascular Disease

## 2023-12-29 ENCOUNTER — Encounter: Payer: Self-pay | Admitting: Cardiovascular Disease

## 2023-12-29 MED ORDER — METOPROLOL SUCCINATE ER 25 MG PO TB24: 12.5000 mg | ORAL_TABLET | Freq: Every day | ORAL | 3 refills | Status: AC

## 2024-03-24 ENCOUNTER — Encounter: Payer: 59 | Admitting: Family Medicine
